# Patient Record
Sex: Male | Born: 1935 | Race: White | Hispanic: No | Marital: Married | Smoking: Former smoker
Health system: Southern US, Community
[De-identification: ages and names within clinical notes are randomized; demographics above are authoritative.]

## PROBLEM LIST (undated history)

## (undated) DIAGNOSIS — I4510 Unspecified right bundle-branch block: Secondary | ICD-10-CM

## (undated) DIAGNOSIS — J9811 Atelectasis: Secondary | ICD-10-CM

## (undated) DIAGNOSIS — I951 Orthostatic hypotension: Secondary | ICD-10-CM

## (undated) DIAGNOSIS — K649 Unspecified hemorrhoids: Secondary | ICD-10-CM

## (undated) HISTORY — PX: CATARACT EXTRACTION: SUR2

## (undated) HISTORY — PX: RETINAL DETACHMENT SURGERY: SHX105

## (undated) HISTORY — DX: Atelectasis: J98.11

## (undated) HISTORY — DX: Unspecified hemorrhoids: K64.9

## (undated) HISTORY — DX: Unspecified right bundle-branch block: I45.10

## (undated) HISTORY — DX: Orthostatic hypotension: I95.1

---
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
## 1970-02-24 HISTORY — PX: VASECTOMY: SHX75

## 1997-02-24 HISTORY — PX: CORNEAL TRANSPLANT: SHX108

## 2001-12-10 ENCOUNTER — Encounter: Admission: RE | Admit: 2001-12-10 | Discharge: 2001-12-10 | Payer: Self-pay | Admitting: *Deleted

## 2001-12-10 ENCOUNTER — Encounter: Payer: Self-pay | Admitting: *Deleted

## 2002-04-01 ENCOUNTER — Encounter: Admission: RE | Admit: 2002-04-01 | Discharge: 2002-04-01 | Payer: Self-pay | Admitting: *Deleted

## 2002-04-01 ENCOUNTER — Encounter: Payer: Self-pay | Admitting: *Deleted

## 2002-12-27 ENCOUNTER — Emergency Department (HOSPITAL_COMMUNITY): Admission: AD | Admit: 2002-12-27 | Discharge: 2002-12-27 | Payer: Self-pay | Admitting: Emergency Medicine

## 2003-06-02 ENCOUNTER — Encounter: Admission: RE | Admit: 2003-06-02 | Discharge: 2003-06-02 | Payer: Self-pay | Admitting: Family Medicine

## 2003-06-13 ENCOUNTER — Ambulatory Visit (HOSPITAL_COMMUNITY): Admission: RE | Admit: 2003-06-13 | Discharge: 2003-06-13 | Payer: Self-pay | Admitting: Gastroenterology

## 2005-12-04 ENCOUNTER — Encounter: Admission: RE | Admit: 2005-12-04 | Discharge: 2005-12-04 | Payer: Self-pay | Admitting: *Deleted

## 2006-04-29 ENCOUNTER — Encounter: Admission: RE | Admit: 2006-04-29 | Discharge: 2006-04-29 | Payer: Self-pay | Admitting: *Deleted

## 2006-06-30 ENCOUNTER — Encounter: Admission: RE | Admit: 2006-06-30 | Discharge: 2006-06-30 | Payer: Self-pay | Admitting: Endocrinology

## 2006-06-30 ENCOUNTER — Encounter (INDEPENDENT_AMBULATORY_CARE_PROVIDER_SITE_OTHER): Payer: Self-pay | Admitting: Specialist

## 2006-06-30 ENCOUNTER — Other Ambulatory Visit: Admission: RE | Admit: 2006-06-30 | Discharge: 2006-06-30 | Payer: Self-pay | Admitting: Interventional Radiology

## 2008-02-25 DIAGNOSIS — I4510 Unspecified right bundle-branch block: Secondary | ICD-10-CM

## 2008-02-25 HISTORY — DX: Unspecified right bundle-branch block: I45.10

## 2010-07-12 NOTE — Op Note (Signed)
NAME:  Darrell Ware, Darrell Ware                         ACCOUNT NO.:  [id number]   MEDICAL RECORD NO.:  [id number]                   PATIENT TYPE:  AMB   LOCATION:  ENDO                                 FACILITY:  [facility]   PHYSICIAN:  Graylin Shiver, M.D.                DATE OF BIRTH:  [birth year]   DATE OF PROCEDURE:  06/13/2003  DATE OF DISCHARGE:                                 OPERATIVE REPORT   PROCEDURE PERFORMED:  Colonoscopy.   INDICATIONS FOR PROCEDURE:  Screening.   Informed consent was obtained after explanation of the risks of bleeding,  infection, and perforation.   PREMEDICATIONS:  Fentanyl 50 mcg  IV, Versed 5 mg IV.   DESCRIPTION OF PROCEDURE:  With the patient in the left lateral decubitus  position, a rectal exam was performed and no masses were felt.  The Olympus  colonoscope was inserted into the rectum and advanced around the colon to  the cecum.  Cecal landmarks were identified.  The scope was brought out  visualizing the colonic mucosa.  There were scattered diverticula around the  colon.  The cecum looked normal.  The ascending colon showed some scattered  diverticula as did the transverse colon.  The descending colon and sigmoid  showed moderate diverticulosis.  The rectum was normal.  The patient  tolerated the procedure well without complications.   IMPRESSION:  Diverticulosis.   PLAN:  I would recommend a follow-up screening colonoscopy again in 10  years.                                               Graylin Shiver, M.D.    Germain Osgood  D:  06/13/2003  T:  06/14/2003  Job:  [id number]   cc:   Lavonda Jumbo, M.D.  Fax: [phone]

## 2011-05-22 ENCOUNTER — Other Ambulatory Visit: Payer: Self-pay | Admitting: Geriatric Medicine

## 2011-05-22 DIAGNOSIS — R9389 Abnormal findings on diagnostic imaging of other specified body structures: Secondary | ICD-10-CM

## 2011-05-26 ENCOUNTER — Ambulatory Visit
Admission: RE | Admit: 2011-05-26 | Discharge: 2011-05-26 | Disposition: A | Discharge status: Home / Self Care | Payer: Medicare Other | Source: Ambulatory Visit | Attending: Geriatric Medicine | Admitting: Geriatric Medicine

## 2011-05-26 DIAGNOSIS — R9389 Abnormal findings on diagnostic imaging of other specified body structures: Secondary | ICD-10-CM

## 2011-06-16 ENCOUNTER — Encounter: Payer: Self-pay | Admitting: Pulmonary Disease

## 2011-06-17 ENCOUNTER — Ambulatory Visit (INDEPENDENT_AMBULATORY_CARE_PROVIDER_SITE_OTHER): Payer: Medicare Other | Admitting: Pulmonary Disease

## 2011-06-17 ENCOUNTER — Encounter: Payer: Self-pay | Admitting: Pulmonary Disease

## 2011-06-17 ENCOUNTER — Other Ambulatory Visit (INDEPENDENT_AMBULATORY_CARE_PROVIDER_SITE_OTHER): Payer: Medicare Other

## 2011-06-17 VITALS — BP 140/80 | HR 75 | Temp 97.9°F | Ht 75.0 in | Wt 189.0 lb

## 2011-06-17 DIAGNOSIS — J849 Interstitial pulmonary disease, unspecified: Secondary | ICD-10-CM

## 2011-06-17 DIAGNOSIS — J8410 Pulmonary fibrosis, unspecified: Secondary | ICD-10-CM

## 2011-06-17 DIAGNOSIS — J84112 Idiopathic pulmonary fibrosis: Secondary | ICD-10-CM | POA: Insufficient documentation

## 2011-06-17 LAB — SEDIMENTATION RATE: Sed Rate: 41 mm/hr — ABNORMAL HIGH (ref 0–22)

## 2011-06-17 NOTE — Assessment & Plan Note (Signed)
The patient has significant subpleural interstitial lung disease on his recent CT chest that most closely fits usual interstitial pneumonitis.  There was no evidence for interstitial disease on a 2004 chest x-ray, but there may have been subtle abnormalities on the x-ray in 2007.  The patient denies any occupational exposures, has not been on any chronic medications that are known to cause interstitial lung disease, and has no history to suggest autoimmune disease.  Most likely, this is idiopathic pulmonary fibrosis.  Will schedule for pulmonary function studies, and also will do blood work for autoimmune screening.  Will see the patient back for discussion of his test results.  His chronic cough that he is describing has been present for 25 years, and sounds more upper airway in origin.  It is unlikely it has anything to do with his lung disease, though can't exclude a contribution.

## 2011-06-17 NOTE — Progress Notes (Signed)
  Subjective:    Patient ID: Darrell Ware, male    DOB: 01/09/1927, 78 y.o.   MRN: [id number]  HPI The patient is an 78 year old male who I've been asked to see for an abnormal CT chest.  The patient is very vigorous and active, and over the last one year has noticed a progressive dyspnea on exertion.  He normally exercises regularly except during the winter, and last summer he noticed that his exercise tolerance was not improving after a layoff.  The patient can walk miles on flat ground at a mild to moderate pace, but any elevations are an issue for him.  He has noticed recently that just walking through his house briskly can sometimes cause him to be short of breath.  He has had a recent CT chest that shows subpleural fibrosis, old granulomatous disease, and a 6 mm noncalcified nodule in the right upper lobe.  Chest x-rays from 2004 and 2007 do not show significant abnormalities, although these 2007 film shows possibly subtle increased interstitial markings.  The patient denies any occupational or asbestos exposure, and has no history to suggest autoimmune disease.  He has no family history of autoimmune disease.  He has never been on amiodarone, or chronic nitrofurantoin treatment.  He has had no significant cardiac issues.  He has had a mild dry cough dating back at least 25 years, and is of intermittent severity.  From his description, this sounds more upper airway, and related to the upper airway cough syndrome.   Review of Systems  Constitutional: Negative for fever and unexpected weight change.  HENT: Positive for congestion, sneezing and trouble swallowing. Negative for ear pain, nosebleeds, sore throat, rhinorrhea, dental problem, postnasal drip and sinus pressure.   Eyes: Negative for redness and itching.  Respiratory: Positive for cough and shortness of breath. Negative for chest tightness and wheezing.   Cardiovascular: Negative for palpitations and leg swelling.  Gastrointestinal:  Negative for nausea and vomiting.  Genitourinary: Negative for dysuria.  Musculoskeletal: Negative for joint swelling.  Skin: Negative for rash.  Neurological: Negative for headaches.  Hematological: Does not bruise/bleed easily.  Psychiatric/Behavioral: Negative for dysphoric mood. The patient is not nervous/anxious.        Objective:   Physical Exam Constitutional:  Well developed, no acute distress  HENT:  Nares patent without discharge, but septal deviation to left  Oropharynx without exudate, palate and uvula are normal  Eyes:  irreg right pupil post surgey, eomi, no scleral icterus  Neck:  No JVD, no TMG  Cardiovascular:  Normal rate, regular rhythm, no rubs or gallops.  No murmurs        Intact distal pulses  Pulmonary :  Normal breath sounds, no stridor or respiratory distress   No  rhonchi, or wheezing, faint bibasilar crackles.  Abdominal:  Soft, nondistended, bowel sounds present.  No tenderness noted.   Musculoskeletal:  No lower extremity edema noted.  Lymph Nodes:  No cervical lymphadenopathy noted  Skin:  No cyanosis noted  Neurologic:  Alert, appropriate, moves all 4 extremities without obvious deficit.         Assessment & Plan:

## 2011-06-17 NOTE — Patient Instructions (Signed)
Will check breathing tests, and arrange for followup once these are done. Will check bloodwork for possible autoimmune disease.

## 2011-06-18 LAB — CYCLIC CITRUL PEPTIDE ANTIBODY, IGG: Cyclic Citrullin Peptide Ab: <2 U/mL (ref 0.0–5.0)

## 2011-06-18 LAB — RHEUMATOID FACTOR: Rhuematoid fact SerPl-aCnc: <10 IU/mL (ref <=14)

## 2011-06-18 LAB — C-REACTIVE PROTEIN: CRP: 2.6 mg/dL — ABNORMAL HIGH (ref <0.60)

## 2011-06-18 LAB — ANTI-RIBONUCLEIC ACID ANTIBODY: SM/RNP: <1 AU/mL (ref <30)

## 2011-07-01 ENCOUNTER — Ambulatory Visit (INDEPENDENT_AMBULATORY_CARE_PROVIDER_SITE_OTHER): Payer: Medicare Other | Admitting: Pulmonary Disease

## 2011-07-01 DIAGNOSIS — J849 Interstitial pulmonary disease, unspecified: Secondary | ICD-10-CM

## 2011-07-01 DIAGNOSIS — J8410 Pulmonary fibrosis, unspecified: Secondary | ICD-10-CM

## 2011-07-01 LAB — PULMONARY FUNCTION TEST

## 2011-07-01 NOTE — Progress Notes (Signed)
PFT done today. 

## 2011-07-02 ENCOUNTER — Telehealth: Payer: Self-pay | Admitting: Pulmonary Disease

## 2011-07-02 NOTE — Telephone Encounter (Signed)
Pt needs ov to review his pfts and bloodwork.  Thanks.

## 2011-07-02 NOTE — Telephone Encounter (Signed)
LMOMTCB x1 for pt 

## 2011-07-04 NOTE — Telephone Encounter (Signed)
LMOMTCB x1 for pt 

## 2011-07-07 NOTE — Telephone Encounter (Signed)
LMOMTCB x 1 

## 2011-07-08 NOTE — Telephone Encounter (Signed)
Pt returned Lori's call & can be reached at [phone].  Darrell Ware

## 2011-07-08 NOTE — Telephone Encounter (Signed)
Patient is scheduled for follow-up on Fri., 07/25/11 @ 9:15am w/ KC.

## 2011-07-25 ENCOUNTER — Ambulatory Visit (INDEPENDENT_AMBULATORY_CARE_PROVIDER_SITE_OTHER): Payer: Medicare Other | Admitting: Pulmonary Disease

## 2011-07-25 ENCOUNTER — Encounter: Payer: Self-pay | Admitting: Pulmonary Disease

## 2011-07-25 VITALS — BP 122/62 | HR 73 | Temp 97.5°F | Ht 75.0 in | Wt 192.4 lb

## 2011-07-25 DIAGNOSIS — J849 Interstitial pulmonary disease, unspecified: Secondary | ICD-10-CM

## 2011-07-25 DIAGNOSIS — J8410 Pulmonary fibrosis, unspecified: Secondary | ICD-10-CM

## 2011-07-25 NOTE — Assessment & Plan Note (Signed)
The patient has interstitial lung disease that is most consistent with idiopathic pulmonary fibrosis.  The pattern is classic by CT chest, and he has no known exposures or indications for an autoimmune process.  I had a long discussion with he and his wife about IPF, and also the lack of good treatment options.  The good news here is the patient is quite active, and his pulmonary function studies are not overly abnormal.  I have offered to refer him to [facility] for consideration of research trials, but he is not interested at this moment.  Hopefully, pirfenidone will be approved next year.  In the meantime, I have asked him to stay very active, and keep his weight under good control.  I would like to see him back in one year for followup pulmonary function studies and chest x-ray, but he is to call if his breathing worsens in the interim.  I have spent 32 minutes with the patient and his wife today discussing his diagnosis, prognosis, and also possible treatment options.  I have answered all of their questions.

## 2011-07-25 NOTE — Progress Notes (Signed)
  Subjective:    Patient ID: Darrell Ware, male    DOB: [birth year], 78 y.o.   MRN: [id number]  HPI The patient comes in today for followup of his known interstitial lung disease.  His CT is most consistent with usual interstitial pneumonitis, and a recent autoimmune workup was unremarkable.  He has also had pulmonary function test recently, and that shows no obstructive lung disease, no restriction, and a mildly decreased diffusion capacity.  I have reviewed this study with him in detail, and answered all of his questions.   Review of Systems  Constitutional: Negative.  Negative for fever and unexpected weight change.  HENT: Negative.  Negative for ear pain, nosebleeds, congestion, sore throat, rhinorrhea, sneezing, trouble swallowing, dental problem, postnasal drip and sinus pressure.   Eyes: Negative.  Negative for redness and itching.  Respiratory: Positive for cough, shortness of breath and wheezing. Negative for chest tightness.   Cardiovascular: Negative.  Negative for palpitations and leg swelling.  Gastrointestinal: Negative.  Negative for nausea and vomiting.  Genitourinary: Negative.  Negative for dysuria.  Musculoskeletal: Negative.  Negative for joint swelling.  Skin: Negative.  Negative for rash.  Neurological: Negative.  Negative for headaches.  Hematological: Negative.  Does not bruise/bleed easily.  Psychiatric/Behavioral: Negative.  Negative for dysphoric mood. The patient is not nervous/anxious.        Objective:   Physical Exam Thin male in no acute distress Nose without purulence or discharge noted Chest with basilar crackles, no wheezing Cardiac exam is regular rate and rhythm Lower extremities without edema, no cyanosis Alert and oriented, moves all 4 extremities.       Assessment & Plan:

## 2011-07-25 NOTE — Patient Instructions (Signed)
Continue on your exercise program Will see you back in one year with cxr and breathing studies on the same day.  Please call if your condition changes in the interim.

## 2011-08-04 ENCOUNTER — Encounter: Payer: Self-pay | Admitting: Pulmonary Disease

## 2012-07-26 ENCOUNTER — Encounter: Payer: Medicare Other | Admitting: Pulmonary Disease

## 2012-07-26 ENCOUNTER — Ambulatory Visit: Payer: Medicare Other | Admitting: Pulmonary Disease

## 2012-08-13 ENCOUNTER — Ambulatory Visit: Payer: Medicare Other | Admitting: Pulmonary Disease

## 2012-08-30 ENCOUNTER — Ambulatory Visit (INDEPENDENT_AMBULATORY_CARE_PROVIDER_SITE_OTHER)
Admission: RE | Admit: 2012-08-30 | Discharge: 2012-08-30 | Disposition: A | Discharge status: Home / Self Care | Payer: Medicare Other | Source: Ambulatory Visit | Attending: Pulmonary Disease | Admitting: Pulmonary Disease

## 2012-08-30 ENCOUNTER — Ambulatory Visit (INDEPENDENT_AMBULATORY_CARE_PROVIDER_SITE_OTHER): Payer: Medicare Other | Admitting: Pulmonary Disease

## 2012-08-30 ENCOUNTER — Encounter: Payer: Self-pay | Admitting: Pulmonary Disease

## 2012-08-30 VITALS — BP 130/64 | HR 73 | Temp 97.2°F | Ht 73.5 in | Wt 190.0 lb

## 2012-08-30 DIAGNOSIS — J849 Interstitial pulmonary disease, unspecified: Secondary | ICD-10-CM

## 2012-08-30 DIAGNOSIS — J8410 Pulmonary fibrosis, unspecified: Secondary | ICD-10-CM

## 2012-08-30 LAB — PULMONARY FUNCTION TEST

## 2012-08-30 NOTE — Assessment & Plan Note (Signed)
The patient has had a significant decline in his pulmonary function studies and also his exertional tolerance since last visit.  We'll obviously need to have a chest x-ray done to look for worsening from a radiographic standpoint.  His history and laboratory findings are most consistent with idiopathic pulmonary fibrosis.  I have had another discussion with him about the pirfenidone trial, including the expanded access program.  He would like to discuss further with his wife who is a pharmacist, and will get back with me with his decision.  I have also offered to refer him to [facility] for other investigational trials.  Finally, I have stressed to him the importance of a consistent conditioning program, and offered to refer him to pulmonary rehabilitation.  He would like to do this on his own if possible

## 2012-08-30 NOTE — Progress Notes (Signed)
PFT done today. 

## 2012-08-30 NOTE — Patient Instructions (Addendum)
Think about the pirfenidone trial, and let me know Consider either pulmonary rehab or increasing your exercise program at home. Will send you downstairs for a chest xray, and will call you with results. followup with me in 6mos.

## 2012-08-30 NOTE — Progress Notes (Signed)
  Subjective:    Patient ID: Darrell Ware, male    DOB: 1926-09-22, 79 y.o.   MRN: [id number]  HPI The patient comes in today for followup of his interstitial lung disease, felt secondary to idiopathic pulmonary fibrosis.  Since last visit, he is clearly seen a decline in his exertional tolerance, and has started interfering with his scheduled exercise program.  He has had a mild dry cough, but does not feel that it is overly significant.  He had pulmonary function studies today that showed no airflow obstruction, mild to moderate restriction, and a severe decrease in his diffusion capacity.  Of note, there is been a significant decrease in his total lung capacity and DLCO since the last check one year ago.   Review of Systems  Constitutional: Negative for fever and unexpected weight change.  HENT: Negative for ear pain, nosebleeds, congestion, sore throat, rhinorrhea, sneezing, trouble swallowing, dental problem, postnasal drip and sinus pressure.   Eyes: Negative for redness and itching.  Respiratory: Positive for cough ( pt thinks d/t steroidal eye drops ) and shortness of breath. Negative for chest tightness and wheezing.   Cardiovascular: Negative for palpitations and leg swelling.  Gastrointestinal: Negative for nausea and vomiting.  Genitourinary: Negative for dysuria.  Musculoskeletal: Negative for joint swelling.  Skin: Negative for rash.  Neurological: Negative for headaches.  Hematological: Does not bruise/bleed easily.  Psychiatric/Behavioral: Negative for dysphoric mood. The patient is not nervous/anxious.        Objective:   Physical Exam Wd male in nad Nose without purulence or d/c noted. Neck without LN or TMG Chest with crackles 1/3 up bilat, no wheezing Cor with rrr LE without edema, +rash, no cyanosis Alert and oriented, moves all 4.        Assessment & Plan:

## 2012-09-02 ENCOUNTER — Telehealth: Payer: Self-pay | Admitting: Pulmonary Disease

## 2012-09-02 NOTE — Progress Notes (Signed)
Quick Note:  Spoke with patient made him aware of results as listed below per [facility] Verbalized understanding>>> refer to telephone note for further. ______

## 2012-09-02 NOTE — Telephone Encounter (Signed)
Spoke with patient, made him aware of results as listed below Patient requesting to be apart of perfinidone trial now Dr. Shelle Iron please advise thank you      Result Note    Please let pt know that cxr shows fibrosis is similar to recent films, but significant progression from 2007.

## 2012-09-03 NOTE — Telephone Encounter (Signed)
Let pt know that I have sent a message to the research nurse, and she will start the proceedings to see if he qualifies.

## 2012-09-03 NOTE — Telephone Encounter (Signed)
LMTCB

## 2012-09-06 NOTE — Telephone Encounter (Signed)
i spoke with pt and is aware. Nothing further was needed

## 2012-09-06 NOTE — Telephone Encounter (Signed)
Returning call can be reached at [phone].Darrell Ware

## 2012-09-07 ENCOUNTER — Telehealth: Payer: Self-pay | Admitting: Pulmonary Disease

## 2012-09-07 NOTE — Telephone Encounter (Signed)
Pt has already been notified. Nothing further needed.   Barbaraann Share, MD at 09/03/2012 4:16 PM   Status: Signed            Let pt know that I have sent a message to the research nurse, and she will start the proceedings to see if he qualifies.     Tommie Sams, CMA at 09/06/2012 10:02 AM    Status: Signed             I spoke with pt and is aware. Nothing further was needed

## 2012-09-13 ENCOUNTER — Encounter: Payer: Self-pay | Admitting: Pulmonary Disease

## 2012-09-17 ENCOUNTER — Ambulatory Visit (INDEPENDENT_AMBULATORY_CARE_PROVIDER_SITE_OTHER): Payer: Self-pay | Admitting: Pulmonary Disease

## 2012-09-17 ENCOUNTER — Encounter: Payer: Self-pay | Admitting: Pulmonary Disease

## 2012-09-17 VITALS — BP 122/72 | HR 74 | Temp 98.0°F | Ht 75.0 in | Wt 191.8 lb

## 2012-09-17 DIAGNOSIS — J849 Interstitial pulmonary disease, unspecified: Secondary | ICD-10-CM

## 2012-09-17 DIAGNOSIS — J8410 Pulmonary fibrosis, unspecified: Secondary | ICD-10-CM

## 2012-09-17 LAB — EKG 12-LEAD

## 2012-09-17 NOTE — Progress Notes (Signed)
Chief Complaint  Patient presents with  . Research    Perfinidone Study    History of Present Illness: Darrell Ware is a 79 y.o. male with pulmonary fibrosis.  He is here for evaluation prior to enrolling in pirfenidone study.  He has chronic dry cough.  He has noticed more dyspnea with exertion.  He had reaction to cialis which caused a rash and swelling >> these have gotten better.   He denies chest pain, fever, sweats, joint pain, abdominal pain, nausea, or diarrhea.  PFT from 08/30/12 reviewed >> severe diffusion defect.  ECG today shows sinus rhythm with RBBB and QTc 402 msec.  Darrell Ware  has a past medical history of Hemorrhoid; Postural hypotension; Chronic bronchitis; RBBB (2010); Atelectasis; and Inguinal hernia.  Darrell Ware  has past surgical history that includes Tonsillectomy (1935); left arm fracture (1950); Cataract extraction; Retinal detachment surgery; Corneal transplant (1999); and Vasectomy (1972).  Prior to Admission medications   Medication Sig Start Date End Date Taking? Authorizing Provider  AZOPT 1 % ophthalmic suspension Place 1 drop into the right eye 2 (two) times daily. 08/13/12  Yes Historical Provider, MD  Beta Sitosterol 40 % POWD 1 tablet by Does not apply route 2 (two) times daily.   Yes Historical Provider, MD  Cyanocobalamin (B-12) 2500 MCG SUBL Place under the tongue daily.   Yes Historical Provider, MD  Flaxseed, Linseed, (FLAX SEED OIL PO) Take by mouth daily.   Yes Historical Provider, MD  LOTEMAX 0.5 % GEL Place 1 drop into the right eye 3 (three) times daily. 08/13/12  Yes Historical Provider, MD  Multiple Vitamin (MULTIVITAMIN) capsule Take 1 capsule by mouth daily.   Yes Historical Provider, MD  NON FORMULARY NAC 200mg  three times a day   Yes Historical Provider, MD  NON FORMULARY [place] supplement twice daily   Yes Historical Provider, MD  ZIOPTAN 0.0015 % SOLN Place 1 drop into the right eye daily. 08/13/12  Yes Historical  Provider, MD    Allergies  Allergen Reactions  . Cialis (Tadalafil) Rash     Physical Exam:  General - No distress ENT - No sinus tenderness, no oral exudate, no LAN, anisocoria Cardiac - s1s2 regular, no murmur Chest - basilar crackles Back - No focal tenderness Abd - Soft, non-tender Ext - No edema Neuro - Normal strength Skin - faint brown macular rash on arms, legs b/l Psych - normal mood, and behavior   Assessment/Plan:  Darrell Helling, MD [facility] Pager:  [phone]

## 2012-09-17 NOTE — Assessment & Plan Note (Signed)
He is to follow up with Dr. Shelle Iron.  He is to complete evaluation for pirfenidone trial.

## 2012-09-29 ENCOUNTER — Other Ambulatory Visit: Payer: Self-pay

## 2012-12-30 ENCOUNTER — Other Ambulatory Visit: Payer: Self-pay

## 2013-03-02 ENCOUNTER — Encounter: Payer: Self-pay | Admitting: Pulmonary Disease

## 2013-03-02 ENCOUNTER — Ambulatory Visit (INDEPENDENT_AMBULATORY_CARE_PROVIDER_SITE_OTHER): Payer: Medicare Other | Admitting: Pulmonary Disease

## 2013-03-02 VITALS — BP 112/72 | HR 70 | Temp 97.6°F | Ht 75.0 in | Wt 188.0 lb

## 2013-03-02 DIAGNOSIS — J849 Interstitial pulmonary disease, unspecified: Secondary | ICD-10-CM

## 2013-03-02 DIAGNOSIS — J8410 Pulmonary fibrosis, unspecified: Secondary | ICD-10-CM

## 2013-03-02 NOTE — Patient Instructions (Signed)
Continue to stay active and work on physical conditioning. Do some research on OFEV, and let me know if you wish to consider trying the medication. followup with me in 6mos, with breathing studies the same day.

## 2013-03-02 NOTE — Progress Notes (Signed)
   Subjective:    Patient ID: Darrell Ware, male    DOB: June 20, 1926, 80 y.o.   MRN: [id number]  HPI Patient comes in today for followup of his known pulmonary fibrosis, but is felt secondary to IPF. He is been staying very active and exercises regularly, but feels that his breathing has declined somewhat since the last visit. He was enrolled in the pirfenidone trial, but was unable to stay on the medication because of diarrhea and malaise.  He continues to have some mild cough, but it is no worse than his usual baseline. He is asking about OFEV today.    Review of Systems  Constitutional: Negative for fever and unexpected weight change.  HENT: Negative for congestion, dental problem, ear pain, nosebleeds, postnasal drip, rhinorrhea, sinus pressure, sneezing, sore throat and trouble swallowing.   Eyes: Negative for redness and itching.  Respiratory: Negative for cough, chest tightness, shortness of breath and wheezing.   Cardiovascular: Negative for palpitations and leg swelling.  Gastrointestinal: Negative for nausea and vomiting.  Genitourinary: Negative for dysuria.  Musculoskeletal: Negative for joint swelling.  Skin: Negative for rash.  Neurological: Negative for headaches.  Hematological: Does not bruise/bleed easily.  Psychiatric/Behavioral: Negative for dysphoric mood. The patient is not nervous/anxious.        Objective:   Physical Exam Well-developed male in no acute distress Nose without purulence or discharge noted Neck without lymphadenopathy or thyromegaly Chest with basilar crackles, otherwise clear Cardiac exam with regular rate and rhythm Lower extremities without edema, no cyanosis Alert and oriented, moves all 4 extremities.       Assessment & Plan:

## 2013-03-02 NOTE — Assessment & Plan Note (Signed)
The pt continues to be active and exercises regularly, but has seen some decline in his exertional tolerance over the last 6mos.  I have had a long discussion with him about OFEV, and he will do some research and make a decision.  Will see him back in 6mos with breathing studies.

## 2013-03-02 NOTE — Addendum Note (Signed)
Addended by: Maisie FusGREEN, Diella Gillingham M on: 03/02/2013 02:17 PM   Modules accepted: Orders

## 2013-08-30 ENCOUNTER — Encounter: Payer: Self-pay | Admitting: Pulmonary Disease

## 2013-08-30 ENCOUNTER — Ambulatory Visit: Payer: Medicare Other | Admitting: Pulmonary Disease

## 2013-08-30 ENCOUNTER — Other Ambulatory Visit (INDEPENDENT_AMBULATORY_CARE_PROVIDER_SITE_OTHER): Payer: Medicare Other

## 2013-08-30 ENCOUNTER — Ambulatory Visit (INDEPENDENT_AMBULATORY_CARE_PROVIDER_SITE_OTHER): Payer: Medicare Other | Admitting: Pulmonary Disease

## 2013-08-30 VITALS — BP 110/68 | HR 64 | Temp 97.8°F | Ht 73.0 in | Wt 187.0 lb

## 2013-08-30 DIAGNOSIS — Z5181 Encounter for therapeutic drug level monitoring: Secondary | ICD-10-CM

## 2013-08-30 DIAGNOSIS — J849 Interstitial pulmonary disease, unspecified: Secondary | ICD-10-CM

## 2013-08-30 LAB — HEPATIC FUNCTION PANEL
ALT: 21 U/L (ref 0–53)
AST: 23 U/L (ref 0–37)
Albumin: 4 g/dL (ref 3.5–5.2)
Alkaline Phosphatase: 47 U/L (ref 39–117)
BILIRUBIN DIRECT: 0.1 mg/dL (ref 0.0–0.3)
Total Bilirubin: 0.6 mg/dL (ref 0.2–1.2)
Total Protein: 7.1 g/dL (ref 6.0–8.3)

## 2013-08-30 NOTE — Progress Notes (Signed)
   Subjective:    Patient ID: Darrell Ware, male    DOB: 16-Jun-1926, 80 y.o.   MRN: [id number]  HPI The patient comes in today for followup of his idiopathic pulmonary fibrosis. He has continued to be very active since the last visit, but has noted some mild decrease in his exertional tolerance. He has very little dry cough that is intermittent in nature, and really doesn't bother him overall. He has had pulmonary function studies today which showed fairly good stability of his total lung capacity in his diffusion capacity. I have reviewed the studies with him in detail.   Review of Systems  Constitutional: Negative for fever and unexpected weight change.  HENT: Negative for congestion, dental problem, ear pain, nosebleeds, postnasal drip, rhinorrhea, sinus pressure, sneezing, sore throat and trouble swallowing.   Eyes: Negative for redness and itching.  Respiratory: Positive for shortness of breath. Negative for cough, chest tightness and wheezing.   Cardiovascular: Negative for palpitations and leg swelling.  Gastrointestinal: Negative for nausea and vomiting.  Genitourinary: Negative for dysuria.  Musculoskeletal: Negative for joint swelling.  Skin: Negative for rash.  Neurological: Negative for headaches.  Hematological: Does not bruise/bleed easily.  Psychiatric/Behavioral: Negative for dysphoric mood. The patient is not nervous/anxious.        Objective:   Physical Exam  thin male in no acute distress Nose without purulence or discharge noted Neck without lymphadenopathy or thyromegaly Chest with crackles one third the way up bilaterally, no wheezing Cardiac exam with regular rate and rhythm Lower extremities without edema, no cyanosis Alert and oriented, moves all 4 extremities.       Assessment & Plan:

## 2013-08-30 NOTE — Assessment & Plan Note (Signed)
The patient's pulmonary function studies today show stability from last year, but the patient feels that he has lost some ground overall. After a long discussion today, he would like to consider starting on OFEV for treatment of his IPF. I have had a long discussion with him about the medication, including its side effects and warnings. Will check liver function tests today to establish baseline, and will see him back after he has been on the medication for 4 weeks.

## 2013-08-30 NOTE — Progress Notes (Signed)
PFT done today. 

## 2013-08-30 NOTE — Patient Instructions (Signed)
Will start paperwork to initiate treatment with OFEV. Will check baseline liver tests today. Continue exercise program Would like to see you back 4 weeks after starting OFEV.

## 2013-09-05 ENCOUNTER — Telehealth: Payer: Self-pay | Admitting: Pulmonary Disease

## 2013-09-05 LAB — PULMONARY FUNCTION TEST
DL/VA % pred: 55 %
DL/VA: 2.63 ml/min/mmHg/L
DLCO unc % pred: 37 %
DLCO unc: 13.51 ml/min/mmHg
FEF 25-75 POST: 3.6 L/s
FEF 25-75 Pre: 2.96 L/sec
FEF2575-%Change-Post: 21 %
FEF2575-%PRED-POST: 178 %
FEF2575-%Pred-Pre: 146 %
FEV1-%CHANGE-POST: 3 %
FEV1-%PRED-POST: 104 %
FEV1-%Pred-Pre: 100 %
FEV1-POST: 3.21 L
FEV1-PRE: 3.09 L
FEV1FVC-%CHANGE-POST: 4 %
FEV1FVC-%PRED-PRE: 111 %
FEV6-%Change-Post: 0 %
FEV6-%PRED-PRE: 95 %
FEV6-%Pred-Post: 94 %
FEV6-Post: 3.84 L
FEV6-Pre: 3.87 L
FEV6FVC-%Change-Post: 0 %
FEV6FVC-%PRED-PRE: 106 %
FEV6FVC-%Pred-Post: 106 %
FVC-%CHANGE-POST: 0 %
FVC-%Pred-Post: 89 %
FVC-%Pred-Pre: 89 %
FVC-PRE: 3.89 L
FVC-Post: 3.88 L
PRE FEV1/FVC RATIO: 79 %
PRE FEV6/FVC RATIO: 99 %
Post FEV1/FVC ratio: 83 %
Post FEV6/FVC ratio: 99 %
RV % PRED: 46 %
RV: 1.35 L
TLC % pred: 69 %
TLC: 5.31 L

## 2013-09-05 NOTE — Telephone Encounter (Signed)
For one year.

## 2013-09-05 NOTE — Telephone Encounter (Signed)
Please advise how many refill pt may have Dr. Shelle Ironlance thanks

## 2013-09-05 NOTE — Telephone Encounter (Signed)
I called spoke with Stacy.  She is not sure of the notes documented.  They show approval for the medication. She is going to have a rep call us tomorrow. Will await call

## 2013-09-12 ENCOUNTER — Telehealth: Payer: Self-pay | Admitting: Pulmonary Disease

## 2013-09-12 NOTE — Telephone Encounter (Signed)
The medication is ofev.Caren GriffinsStanley A Dalton

## 2013-09-12 NOTE — Telephone Encounter (Signed)
Per OV note pt is too follow-up 4 weeks after starting OFEV and KC states we can do lft at that time. OV set for 10-10-13. Carron CurieJennifer Castillo, CMA

## 2013-09-28 IMAGING — CT CT CHEST W/O CM
3 of 6 series · 15 of 30 positions shown, 16 images · non-contrast
Comparison: None

CLINICAL DATA: Evaluate for interstitial lung disease

CT CHEST WITHOUT CONTRAST
TECHNIQUE: Multidetector CT imaging of the chest was performed
following the standard protocol without IV contrast.

[Series 3: chest w/o · axial · non-contrast · 0.78mm/px · z∈[-221,-51]mm · 3 of 68 slices shown]
[im 17/68  lung]
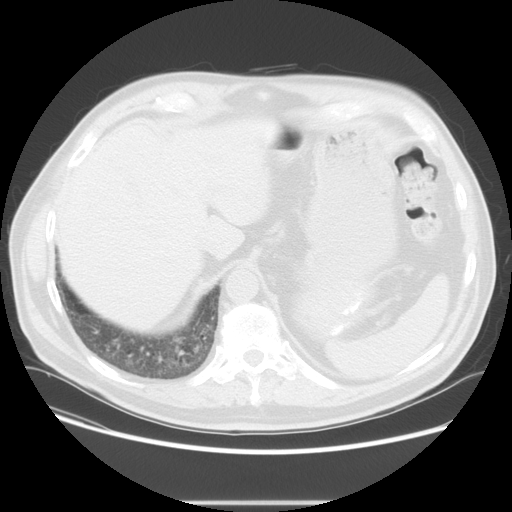
[im 34/68  lung]
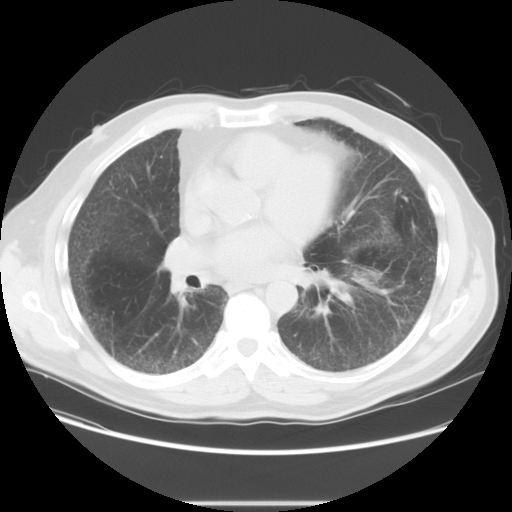
[im 51/68  lung]
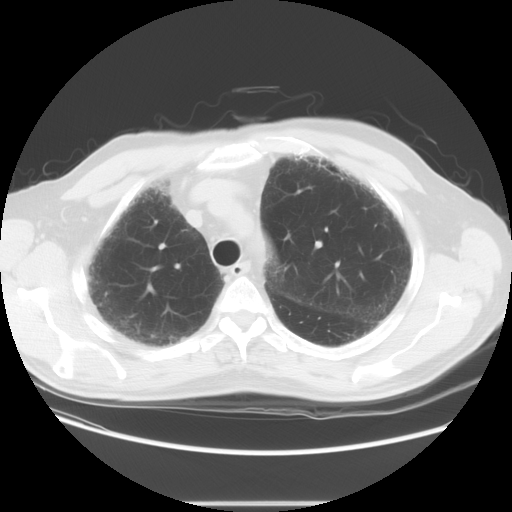

[Series 4: lung windows · axial · 0.78mm/px · z∈[-211,-31]mm · 4 of 62 slices shown, 5 images]
[im 13/62  mediastinal]
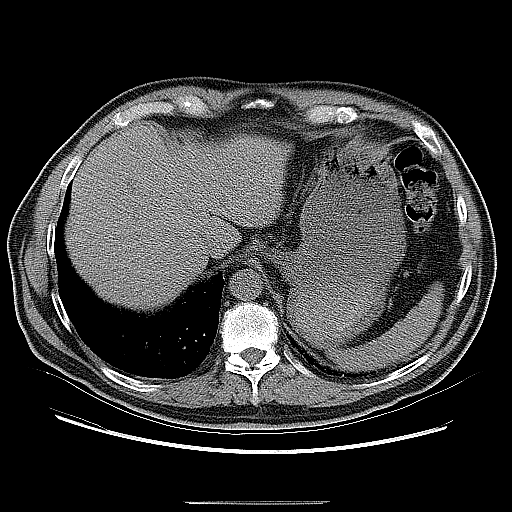
[im 13/62  lung]
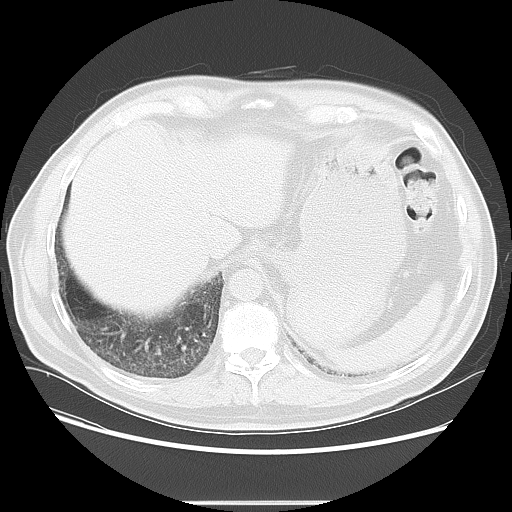
[im 25/62  lung]
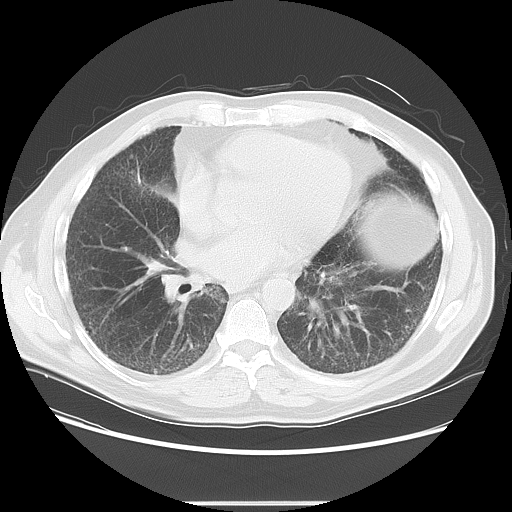
[im 37/62  lung]
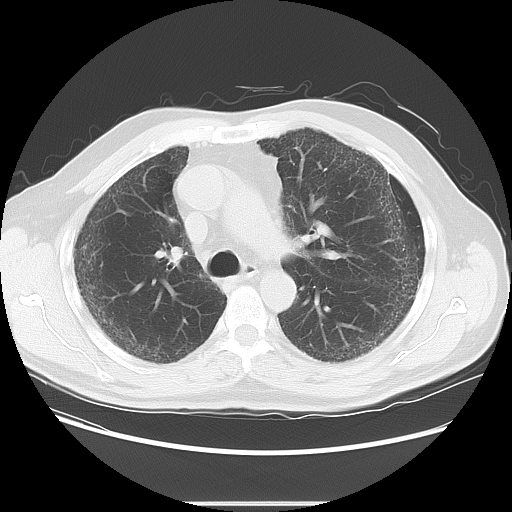
[im 49/62  lung]
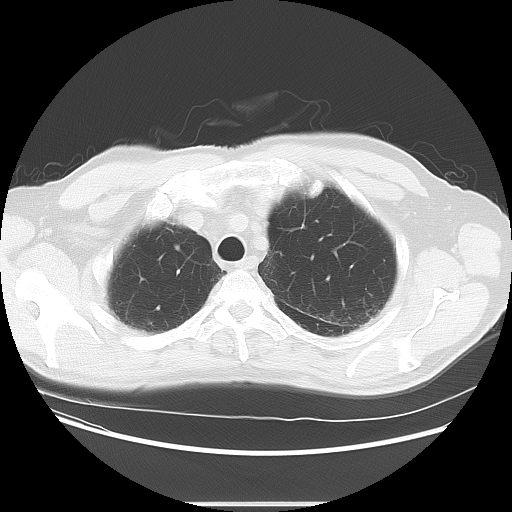

[Series 602: sagittal body · sagittal · 0.78mm/px · 8 of 161 slices shown]
[im 12/161  mediastinal]
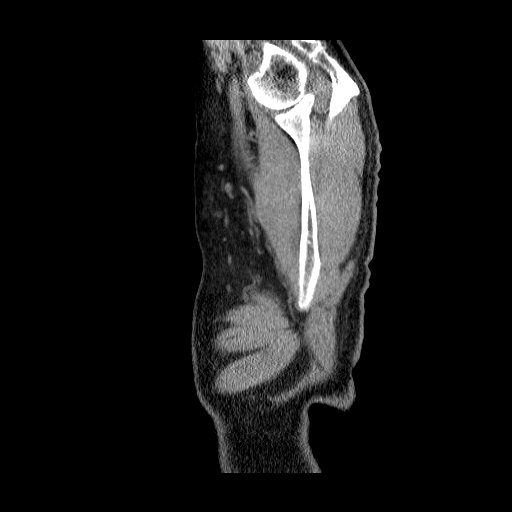
[im 35/161  mediastinal]
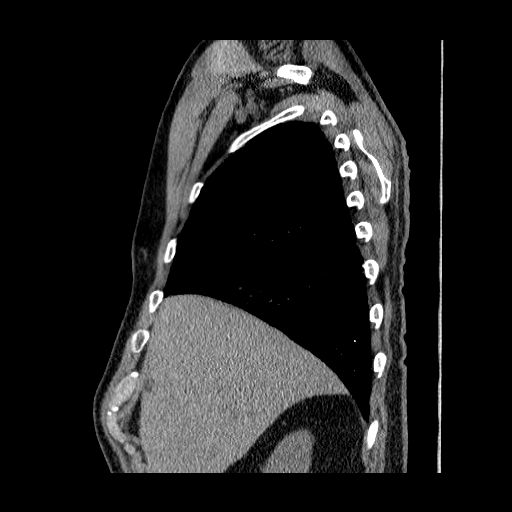
[im 58/161  mediastinal]
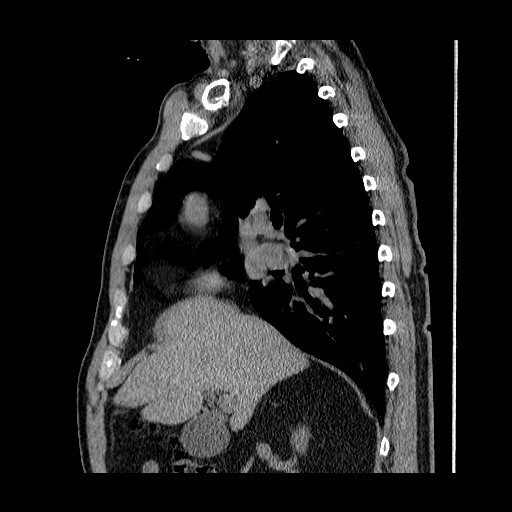
[im 69/161  mediastinal]
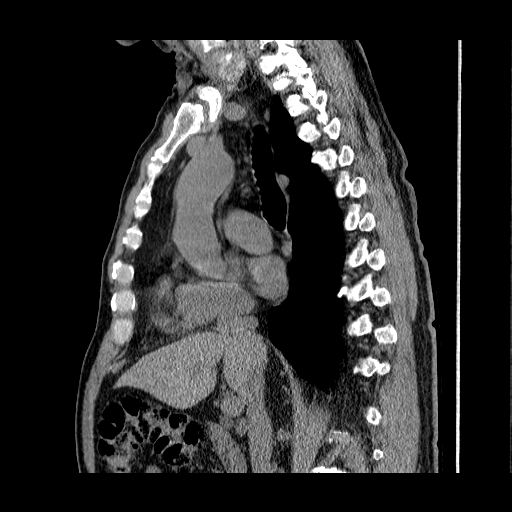
[im 92/161  mediastinal]
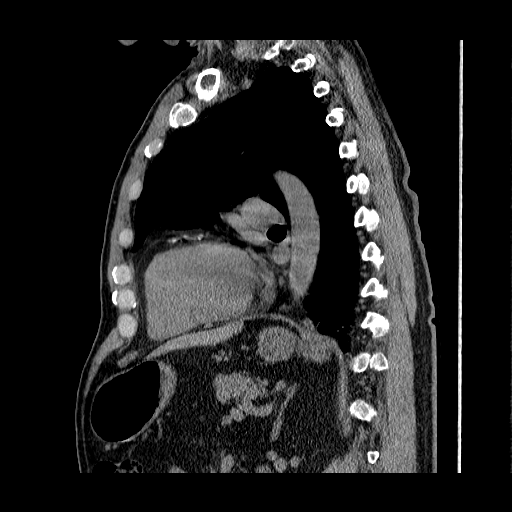
[im 103/161  mediastinal]
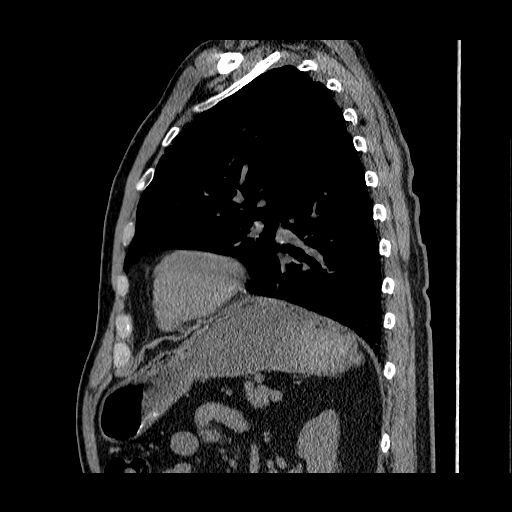
[im 126/161  mediastinal]
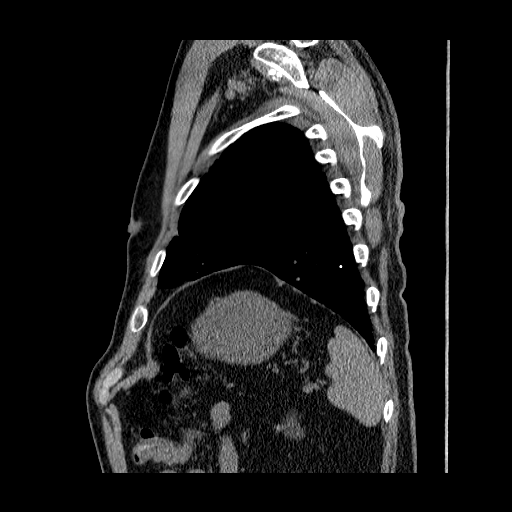
[im 149/161  mediastinal]
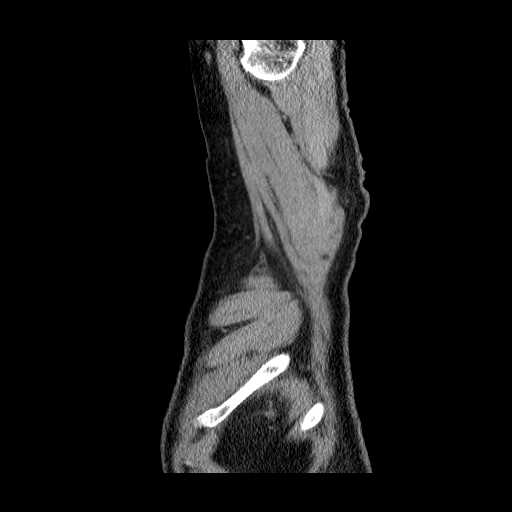

[15 of 30 positions shown; findings below may reference images not displayed]

FINDINGS: There are no enlarged axillary or supraclavicular lymph
nodes.

There are calcifications involving the LAD coronary artery.

No pericardial or pleural effusion.

There is diffuse, peripheral and basilar predominant interstitial
reticulation.  Suspect mild honeycombing within the lung bases.

Several small granulomas are identified within both lungs.  There
also several noncalcified nodules.  The largest is in the right
upper lobe and measures 6.3 mm, image 22.

Review of the visualized bony structures is unremarkable.

Limited imaging through the upper abdomen demonstrates a small
nonobstructing calculus within the upper pole the left kidney.
There is a simple appearing cyst within the left kidney which
measures 4.6 the centimeters. This is incompletely characterized
without IV contrast.  Small cyst is noted within the anterior right
hepatic lobe measuring 6 mm.
IMPRESSION: 1.  Bilateral, peripheral and basilar predominant interstitial
reticulation and mild subpleural honeycombing is identified and is
suspicious for pulmonary fibrosis.  Advise further evaluation with
pulmonary function tests.
2.  Prior granulomatous disease.
3.  Noncalcified nodule within the right upper lobe measures
mm. If the patient is at high risk for bronchogenic carcinoma,
follow-up chest CT at 6-12 months is recommended.  If the patient
is at low risk for bronchogenic carcinoma, follow-up chest CT at 12
months is recommended.  This recommendation follows the consensus
statement: Guidelines for Management of Small Pulmonary Nodules
Detected on CT Scans: A Statement from the [HOSPITAL] as
published in Radiology 4998; [DATE].

## 2013-10-10 ENCOUNTER — Other Ambulatory Visit (INDEPENDENT_AMBULATORY_CARE_PROVIDER_SITE_OTHER): Payer: Medicare Other

## 2013-10-10 ENCOUNTER — Ambulatory Visit (INDEPENDENT_AMBULATORY_CARE_PROVIDER_SITE_OTHER): Payer: Medicare Other | Admitting: Pulmonary Disease

## 2013-10-10 ENCOUNTER — Encounter: Payer: Self-pay | Admitting: Pulmonary Disease

## 2013-10-10 VITALS — BP 110/72 | HR 72 | Temp 97.6°F | Ht 75.0 in | Wt 188.4 lb

## 2013-10-10 DIAGNOSIS — J849 Interstitial pulmonary disease, unspecified: Secondary | ICD-10-CM

## 2013-10-10 DIAGNOSIS — J8410 Pulmonary fibrosis, unspecified: Secondary | ICD-10-CM

## 2013-10-10 LAB — HEPATIC FUNCTION PANEL
ALBUMIN: 3.6 g/dL (ref 3.5–5.2)
ALT: 23 U/L (ref 0–53)
AST: 25 U/L (ref 0–37)
Alkaline Phosphatase: 48 U/L (ref 39–117)
Bilirubin, Direct: 0.1 mg/dL (ref 0.0–0.3)
TOTAL PROTEIN: 6.8 g/dL (ref 6.0–8.3)
Total Bilirubin: 0.5 mg/dL (ref 0.2–1.2)

## 2013-10-10 NOTE — Patient Instructions (Signed)
Will put labwork in the computer to get liver tests in one month, then a month after that. Stay on OFEV, and call if having any tolerance issues. followup with me again in 3mos.

## 2013-10-10 NOTE — Assessment & Plan Note (Signed)
The patient is doing very well on his anti-fibrotic therapy, and is having none of the usual side effects. He continues to stay very active, and has no significant cough. I would like to continue him on the medication, and  keep up with his liver function tests. I would like to see him back in 3 months to check on things.

## 2013-10-10 NOTE — Progress Notes (Signed)
   Subjective:    Patient ID: Darrell BasquesDenver H Umbarger, male    DOB: [birth year], 80 y.o.   MRN: [id number]  HPI The patient comes in today for followup of his idiopathic pulmonary fibrosis. He has been started on OFEV, and has done very well over the last 4 weeks. He is having no significant diarrhea or dyspepsia. He had his liver function test checked today, and we'll do this monthly for 2 more draws. He continues to stay very active, and has seen no change in his breathing. He has no significant cough.   Review of Systems  Constitutional: Negative for fever and unexpected weight change.  HENT: Negative for congestion, dental problem, ear pain, nosebleeds, postnasal drip, rhinorrhea, sinus pressure, sneezing, sore throat and trouble swallowing.   Eyes: Negative for redness and itching.  Respiratory: Negative for cough, chest tightness, shortness of breath and wheezing.   Cardiovascular: Negative for palpitations and leg swelling.  Gastrointestinal: Negative for nausea and vomiting.  Genitourinary: Negative for dysuria.  Musculoskeletal: Negative for joint swelling.  Skin: Negative for rash.  Neurological: Negative for headaches.  Hematological: Does not bruise/bleed easily.  Psychiatric/Behavioral: Negative for dysphoric mood. The patient is not nervous/anxious.        Objective:   Physical Exam Thin male in no acute distress Nose without purulence or discharge noted Neck without lymphadenopathy or thyromegaly Chest with crackles one third the way up bilaterally, no wheezing Cardiac exam with regular rate and rhythm Lower extremities without edema, no cyanosis Alert and oriented, moves all 4 extremities.       Assessment & Plan:

## 2013-11-11 ENCOUNTER — Telehealth: Payer: Self-pay | Admitting: Pulmonary Disease

## 2013-11-11 NOTE — Telephone Encounter (Signed)
Monthly for 2 more draws

## 2013-11-11 NOTE — Telephone Encounter (Signed)
Spoke with the pt  He is asking when to have LFT's checked again  He thought is was 1 month, but maybe 3 mo? Last labs done 10/10/13  Please advise, thanks!

## 2013-11-11 NOTE — Telephone Encounter (Signed)
Order placed and pt aware Nothing further needed pt

## 2013-11-14 ENCOUNTER — Other Ambulatory Visit (INDEPENDENT_AMBULATORY_CARE_PROVIDER_SITE_OTHER): Payer: Medicare Other

## 2013-11-14 DIAGNOSIS — J84112 Idiopathic pulmonary fibrosis: Secondary | ICD-10-CM

## 2013-11-14 LAB — HEPATIC FUNCTION PANEL
ALK PHOS: 57 U/L (ref 39–117)
ALT: 27 U/L (ref 0–53)
AST: 24 U/L (ref 0–37)
Albumin: 4 g/dL (ref 3.5–5.2)
BILIRUBIN DIRECT: 0.1 mg/dL (ref 0.0–0.3)
TOTAL PROTEIN: 7.6 g/dL (ref 6.0–8.3)
Total Bilirubin: 0.4 mg/dL (ref 0.2–1.2)

## 2013-12-17 ENCOUNTER — Encounter: Payer: Self-pay | Admitting: *Deleted

## 2013-12-20 ENCOUNTER — Telehealth: Payer: Self-pay | Admitting: Pulmonary Disease

## 2013-12-20 DIAGNOSIS — J849 Interstitial pulmonary disease, unspecified: Secondary | ICD-10-CM

## 2013-12-20 NOTE — Telephone Encounter (Signed)
LMTCB- I have placed lab order in [facility] for patient to come by the office and have Hepatic Panel drawn.

## 2013-12-20 NOTE — Telephone Encounter (Signed)
Thanks for letting the patient know of his labs ordered.

## 2013-12-20 NOTE — Telephone Encounter (Signed)
I let pt know that lab orders were in system and that he could come in @ anytime to have drawn.Caren GriffinsStanley A Dalton

## 2013-12-21 ENCOUNTER — Other Ambulatory Visit (INDEPENDENT_AMBULATORY_CARE_PROVIDER_SITE_OTHER): Payer: Medicare Other

## 2013-12-21 DIAGNOSIS — J849 Interstitial pulmonary disease, unspecified: Secondary | ICD-10-CM

## 2013-12-21 LAB — HEPATIC FUNCTION PANEL
ALBUMIN: 3.5 g/dL (ref 3.5–5.2)
ALK PHOS: 51 U/L (ref 39–117)
ALT: 27 U/L (ref 0–53)
AST: 25 U/L (ref 0–37)
BILIRUBIN DIRECT: 0.1 mg/dL (ref 0.0–0.3)
Total Bilirubin: 0.8 mg/dL (ref 0.2–1.2)
Total Protein: 7.2 g/dL (ref 6.0–8.3)

## 2013-12-23 NOTE — Progress Notes (Signed)
Quick Note:  lmtcb ______ 

## 2013-12-27 ENCOUNTER — Telehealth: Payer: Self-pay | Admitting: Pulmonary Disease

## 2013-12-28 NOTE — Telephone Encounter (Signed)
Pt aware of results 

## 2014-01-10 ENCOUNTER — Encounter: Payer: Self-pay | Admitting: Pulmonary Disease

## 2014-01-10 ENCOUNTER — Ambulatory Visit (INDEPENDENT_AMBULATORY_CARE_PROVIDER_SITE_OTHER): Payer: Medicare Other | Admitting: Pulmonary Disease

## 2014-01-10 VITALS — BP 124/62 | HR 66 | Temp 97.0°F | Ht 75.0 in | Wt 192.4 lb

## 2014-01-10 DIAGNOSIS — J84112 Idiopathic pulmonary fibrosis: Secondary | ICD-10-CM

## 2014-01-10 NOTE — Progress Notes (Signed)
   Subjective:    Patient ID: Carlyle BasquesDenver H Chio, male    DOB: 1926/09/09, 80 y.o.   MRN: [id number]  HPI The patient comes in today for follow-up of his known idiopathic pulmonary fibrosis. He has been started on OFEV this summer, and has done very well with the medication. He is having no GI side effects, and his liver function tests have been normal. He remains quite active, and has had no change in his exertional tolerance.   Review of Systems  Constitutional: Negative for fever and unexpected weight change.  HENT: Negative for congestion, dental problem, ear pain, nosebleeds, postnasal drip, rhinorrhea, sinus pressure, sneezing, sore throat and trouble swallowing.   Eyes: Negative for redness and itching.  Respiratory: Positive for shortness of breath. Negative for cough, chest tightness and wheezing.   Cardiovascular: Negative for palpitations and leg swelling.  Gastrointestinal: Negative for nausea and vomiting.  Genitourinary: Negative for dysuria.  Musculoskeletal: Negative for joint swelling.  Skin: Negative for rash.  Neurological: Negative for headaches.  Hematological: Does not bruise/bleed easily.  Psychiatric/Behavioral: Negative for dysphoric mood. The patient is not nervous/anxious.        Objective:   Physical Exam Thin male in no acute distress Nose without purulence or discharge noted Neck without lymphadenopathy or thyromegaly Chest with mild basilar crackles, no wheezes Cardiac exam with regular rate and rhythm Lower extremities without edema, no cyanosis Alert and oriented, moves all 4 extremities.       Assessment & Plan:

## 2014-01-10 NOTE — Assessment & Plan Note (Signed)
The patient is doing very well on his anti-fibrotic medication. He has had no GI issues, and his liver function tests have remained normal. He feels that his functional status is stable, and has been staying very active. He has no significant cough at this time. I would like to see him back in 3-4 months where we will recheck his pulmonary function studies.

## 2014-01-10 NOTE — Patient Instructions (Signed)
Continue on OFEV Stay as active as possible Will see you back in 3-4 mos, with breathing studies and liver tests on same day.

## 2014-05-11 ENCOUNTER — Encounter: Payer: Self-pay | Admitting: Pulmonary Disease

## 2014-05-11 ENCOUNTER — Ambulatory Visit (INDEPENDENT_AMBULATORY_CARE_PROVIDER_SITE_OTHER): Payer: Medicare Other | Admitting: Pulmonary Disease

## 2014-05-11 VITALS — BP 138/64 | HR 70 | Temp 97.0°F | Ht 73.0 in | Wt 191.0 lb

## 2014-05-11 DIAGNOSIS — J84112 Idiopathic pulmonary fibrosis: Secondary | ICD-10-CM

## 2014-05-11 LAB — PULMONARY FUNCTION TEST
DL/VA % PRED: 55 %
DL/VA: 2.65 ml/min/mmHg/L
DLCO UNC: 13.71 ml/min/mmHg
DLCO unc % pred: 37 %
FEF 25-75 POST: 2.82 L/s
FEF 25-75 Pre: 2.85 L/sec
FEF2575-%Change-Post: 0 %
FEF2575-%PRED-POST: 141 %
FEF2575-%Pred-Pre: 143 %
FEV1-%Change-Post: 0 %
FEV1-%PRED-POST: 99 %
FEV1-%Pred-Pre: 99 %
FEV1-Post: 3.02 L
FEV1-Pre: 3.02 L
FEV1FVC-%CHANGE-POST: 0 %
FEV1FVC-%Pred-Pre: 114 %
FEV6-%Change-Post: 0 %
FEV6-%Pred-Post: 92 %
FEV6-%Pred-Pre: 93 %
FEV6-POST: 3.73 L
FEV6-PRE: 3.76 L
FEV6FVC-%Change-Post: 0 %
FEV6FVC-%PRED-POST: 107 %
FEV6FVC-%Pred-Pre: 107 %
FVC-%CHANGE-POST: 0 %
FVC-%PRED-POST: 86 %
FVC-%PRED-PRE: 87 %
FVC-PRE: 3.76 L
FVC-Post: 3.74 L
POST FEV6/FVC RATIO: 100 %
PRE FEV6/FVC RATIO: 100 %
Post FEV1/FVC ratio: 81 %
Pre FEV1/FVC ratio: 80 %
RV % PRED: 47 %
RV: 1.4 L
TLC % PRED: 66 %
TLC: 5.12 L

## 2014-05-11 NOTE — Assessment & Plan Note (Signed)
The patient has known IPF, and has been on OFEV since July of last year without difficulty. Approximate 2 months ago he began to have profuse diarrhea along with bloating and malaise. He discontinued the medication and is now back to baseline. He feels that his breathing is a little worse than his last visit, but not by very much. His PFTs today show only a small decline in his total lung capacity from last year. I have had a long discussion with him about possibly trying OFEV again at a lower dose, [facility] involving other medications, and finally staying off anti-fibrotics and just monitoring his symptoms over time.  After discussion, the patient would like to stay off all medications, and he will work aggressively on physical conditioning. He is having issues with awakening at night with some breathing problems, and therefore I will check overnight oximetry to make sure he is not desaturating.

## 2014-05-11 NOTE — Patient Instructions (Signed)
Stay off OFEV for now Keep active and work on conditioning Will check oxygen level overnight to see if this is an issue for you Will see you back in 6mos, with chest xray on same day.

## 2014-05-11 NOTE — Progress Notes (Signed)
PFT done today. 

## 2014-05-11 NOTE — Progress Notes (Signed)
   Subjective:    Patient ID: Darrell Ware, male    DOB: 1926-08-23, 81 y.o.   MRN: [id number]  HPI The patient comes in today for follow-up of his known idiopathic pulmonary fibrosis. He has been on an anti-fibrotic since the summer, and has done well with the medication until recently. He has noticed starting in January profuse diarrhea, abdominal bloating, and general malaise. He has discontinued OFEV, and feels that he is back to his baseline. He has stayed active, but feels that his breathing may be a little shorter than last year. He has not had any significant cough. He has had PFTs today that appear fairly stable except for a slight decrease in his total lung capacity.   Review of Systems  Constitutional: Negative for fever, chills, activity change, appetite change and unexpected weight change.  HENT: Negative for congestion, dental problem, ear pain, nosebleeds, postnasal drip, rhinorrhea, sinus pressure, sneezing, sore throat, trouble swallowing and voice change.   Eyes: Negative for redness, itching and visual disturbance.  Respiratory: Positive for cough and shortness of breath. Negative for choking, chest tightness and wheezing.   Cardiovascular: Negative for chest pain, palpitations and leg swelling.  Gastrointestinal: Negative for nausea, vomiting and abdominal pain.  Genitourinary: Negative for dysuria and difficulty urinating.  Musculoskeletal: Negative for joint swelling and arthralgias.  Skin: Negative for rash.  Neurological: Negative for headaches.  Hematological: Does not bruise/bleed easily.  Psychiatric/Behavioral: Negative for behavioral problems, confusion and dysphoric mood. The patient is not nervous/anxious.        Objective:   Physical Exam  Thin male in no acute distress Nose without purulence or discharge noted Neck without lymphadenopathy or thyromegaly Chest with crackles one third the way up bilaterally Heart exam with regular rate and rhythm Lower  extremities without edema, no cyanosis Alert and oriented, moves all 4 extremities.      Assessment & Plan:

## 2014-06-12 ENCOUNTER — Telehealth: Payer: Self-pay | Admitting: Pulmonary Disease

## 2014-06-12 NOTE — Telephone Encounter (Signed)
Please let pt know that his oxygen level fell to 85% transiently during the night, but only spent 9 min the entire night less than 88%.  Does not need oxygen.  Good news.

## 2014-06-13 NOTE — Telephone Encounter (Signed)
I spoke with patient about results and he verbalized understanding and had no questions 

## 2014-07-20 ENCOUNTER — Encounter: Payer: Self-pay | Admitting: Pulmonary Disease

## 2014-08-21 ENCOUNTER — Telehealth: Payer: Self-pay | Admitting: Critical Care Medicine

## 2014-08-21 NOTE — Telephone Encounter (Signed)
Made in error

## 2014-08-23 ENCOUNTER — Telehealth: Payer: Self-pay | Admitting: Pulmonary Disease

## 2014-08-23 ENCOUNTER — Ambulatory Visit (INDEPENDENT_AMBULATORY_CARE_PROVIDER_SITE_OTHER): Payer: Medicare Other | Admitting: Pulmonary Disease

## 2014-08-23 ENCOUNTER — Ambulatory Visit (INDEPENDENT_AMBULATORY_CARE_PROVIDER_SITE_OTHER)
Admission: RE | Admit: 2014-08-23 | Discharge: 2014-08-23 | Disposition: A | Discharge status: Home / Self Care | Payer: Medicare Other | Source: Ambulatory Visit | Attending: Pulmonary Disease | Admitting: Pulmonary Disease

## 2014-08-23 ENCOUNTER — Encounter: Payer: Self-pay | Admitting: Pulmonary Disease

## 2014-08-23 VITALS — BP 146/70 | HR 81 | Ht 73.0 in | Wt 184.8 lb

## 2014-08-23 DIAGNOSIS — J9611 Chronic respiratory failure with hypoxia: Secondary | ICD-10-CM

## 2014-08-23 DIAGNOSIS — J8410 Pulmonary fibrosis, unspecified: Secondary | ICD-10-CM

## 2014-08-23 MED ORDER — NINTEDANIB ESYLATE 100 MG PO CAPS: 100.0000 mg | ORAL_CAPSULE | Freq: Two times a day (BID) | ORAL | Status: DC

## 2014-08-23 NOTE — Progress Notes (Signed)
Chief Complaint  Patient presents with  . Follow-up    Former [facility] patient : Feels IPF has worsened over the last 6 months. Pt stopped OFEV in March 2016 d/t having side effects from medication.     History of Present Illness: Darrell Ware is a 81 y.o. male with IPF.  He was previously followed by Dr. Shelle Ironlance.  He has previously been on pirfenidone >> stopped because of elevated LFTS, and Ofev >> stopped due to GI side effects.  He notices trouble with his breathing with activity.  When he rides his stationary bike he gets winded after few minutes.  He checks his pulse ox and readings can go down to as low as 70's.  This improves few minutes after he rests.  He has an occasional dry cough.  He gets chest discomfort with breathing >> this is sporadic and last for a few seconds.  He denies fever, sputum, hemoptysis, joint swelling, or skin rash.  He does not have ankle swelling.  He had ONO on RA in April.  His SaO2 was below 88% for 9 minutes >> he was told he didn't need oxygen at night.   TESTS: CT chest 05/26/11 >> diffuse, peripheral/basilar predominant interstitial reticulation with mild honeycombing, several small granulomas PFT 08/30/13 >> FEV1 3.21 (104%), FEV1% 83, TLC 5.31 (69%), DLCO 37%  Past medical hx, Past surgical hx, Medications, Allergies, Family hx, Social hx all reviewed.   Physical Exam: BP 146/70 mmHg  Pulse 81  Ht 6\' 1"  (1.854 m)  Wt 184 lb 12.8 oz (83.825 kg)  BMI 24.39 kg/m2  SpO2 96%  General - No distress, thin ENT - No sinus tenderness, no oral exudate, no LAN Cardiac - s1s2 regular, no murmur Chest - b/l crackles Back - No focal tenderness Abd - Soft, non-tender Ext - No edema Neuro - Normal strength Skin - No rashes Psych - normal mood, and behavior   Assessment/Plan:  Pulmonary fibrosis. Plan: - will give him script for Ofev at 100 mg bid - will repeat CXR today - will have him f/u with Dr. Marchelle Gearingamaswamy  Hypoxia. SpO2 on room air was  87% with ambulation >> improved with 2 liters oxygen Plan: - will arrange for supplemental oxygen - will repeat ONO on RA   Coralyn HellingVineet Sierra Spargo, MD [facility] Pager:  [phone]

## 2014-08-23 NOTE — Telephone Encounter (Signed)
Dg Chest 2 View  08/23/2014   CLINICAL DATA:  Follow-up pulmonary fibrosis. Shortness breath is getting worse.  EXAM: CHEST  2 VIEW  COMPARISON:  08/30/2012 and 05/26/2011  FINDINGS: Again noted are prominent interstitial lung markings consistent with interstitial lung disease and fibrosis. The distribution disease has not significantly changed. Slightly increased interstitial densities in the left lower lung. No large pleural effusions. No acute bone abnormality. Heart and mediastinum are stable.  IMPRESSION: Prominent interstitial lung markings are compatible with interstitial lung disease. There is concern for mild progression in the left lower lung. No large areas of airspace disease or consolidation.   Electronically Signed   By: Richarda OverlieAdam  Henn M.D.   On: 08/23/2014 15:30    Will have my nurse inform pt that CXR shows expected progression of pulmonary fibrosis compared to CXR from 2014.  No change to current tx plan.

## 2014-08-23 NOTE — Addendum Note (Signed)
Addended by: Maisie FusGREEN, ASHTYN M on: 08/23/2014 03:58 PM   Modules accepted: Orders

## 2014-08-23 NOTE — Patient Instructions (Addendum)
Will arrange for home oxygen set up Chest xray today Will arrange for repeat overnight oxygen test Ofev 100 mg twice per day Follow up in 2 weeks with Dr. Marchelle Gearingamaswamy

## 2014-08-24 NOTE — Telephone Encounter (Signed)
lmomtcb x1 

## 2014-08-24 NOTE — Telephone Encounter (Signed)
[phone], pt cb

## 2014-08-24 NOTE — Telephone Encounter (Signed)
I spoke with patient about results and he verbalized understanding and had no questions 

## 2014-09-01 ENCOUNTER — Ambulatory Visit (INDEPENDENT_AMBULATORY_CARE_PROVIDER_SITE_OTHER): Payer: Medicare Other | Admitting: Internal Medicine

## 2014-09-01 ENCOUNTER — Encounter: Payer: Self-pay | Admitting: Internal Medicine

## 2014-09-01 VITALS — BP 126/70 | HR 71 | Ht 73.0 in | Wt 184.0 lb

## 2014-09-01 DIAGNOSIS — J84112 Idiopathic pulmonary fibrosis: Secondary | ICD-10-CM

## 2014-09-01 NOTE — Patient Instructions (Addendum)
ICD-9-CM ICD-10-CM   1. Idiopathic pulmonary fibrosis 516.31 J84.112     Disease likely has progressed  PLAN - respect desire not to do followup monitoring CT chest  - please do full PFT in 4 week -[facility] device such as INNOGEN - use with exertion - await ONO study result from 08/31/14 - if abnormal - you will need o2 at sleep - refer pulmonary rehab  -TAke ofev using old supply: at 150mg  tablet once daily (old supply) - do order for OFEV 100mg  tablet twice daily - start this when you get the supply and stop above - glad you are participating in [employer] support foundation - flu shot in fall  Followup  - PFT in 4 weeks  - LFT blood test in 4 weeks on OFEV - REturn to see me or NP Tammy to review progress with Ofev -

## 2014-09-01 NOTE — Progress Notes (Signed)
Subjective:    Patient ID: Darrell Ware, male    DOB: [birth year], 81 y.o.   MRN: [id number]  HPI   OV 09/01/2014  Chief Complaint  Patient presents with  . Follow-up    Pt is a former [facility] pt. Pt states he was on OFEV but stoppped March 2016. Pt stated he rapidly declined since not being on the OFEV. When pt saw [facility] pt requested to be placed back on OFEV. Pt is currently on 150mg  BID. Pt c/o DOE, intermittent cough.    Follow-up idiopathic pulmonary fibrosis (diagnosis based on age, male sex, negative autoimmune profile and a CT scan of the chest April 2013]  This is a transfer of care. Patient is to be followed by Dr. Marcelyn BruinsKeith Clance and once by Dr. Clayton LefortV Sood for IPF. He is now following with me for Specialist IPF care. Presents with his wife both give history.  IPF diagnosed in spring of 2013. Then in the summer of 2014 through the expanded access program was on Pirfenidone but developed extremely high liver function anormlaity and had to be discontinued. Subsequently in the summer of 2015 was started on Nintedanib (Ofev) . He tolerated this well for 6 months. Then in the early part of 2016 started developing diarrhea with associated anorexia and weight loss. Symptoms are progressive. Symptoms ultimately became severe o he had abruptly discontinue the drug. Subsequently within a few days he started feeling dramatically better and all side effects resolved. However since that time he is having progressive dyspnea. He feels his interstitial lung disease is worse. A year ago overnight oxygen study and exertional pulse ox while he exercises on a recumbent bike at home showed no desaturation. However now for the last few months he is noticing desaturation into the 85% when he exercises. Correspondingly 2 days ago at our pulmonary office when he exerted he did desaturate to below 88%. He knows wants to restart his Ninetedanib ; wife wants to start him at low dose. He is currently taking old supply at the  full dose at 150 mg twice daily. He is open to attending pulmonary rehabilitation because his home exercise program has trailed off given his worsening symptoms. He is active in the pulmonary fibrosis foundation support group  His pulmonary function test within July 2015 when he was started on Ninntedanib and through March 2016 when he came off the drug have remained stable with moderate disease severity only. His FVC is 3.76 L/87%, total lung capacity is 5.12 L/66% and DLCO is 13.7/37%.       K-BILD ILD QUESTIONNAIRE, Symptom score over prior 2 weeks  7-none, 6-rarely, 5-occ, 5-some times, 3-sev times, 2-most times, 1-every time   Dyspnea for stairs, incline or hill 1  Chest Tightness 4  Worry about seriousness of lung complaint 4  Avoided doing things that make you dyspneic 4  Have you felt loss of control of lung condition (reversed from original) 5  Felt fed up due to lung condition 5  Felt urge to breathe aka air hunger 6  Has lung condition made you feel anxious 4  How often have you experienced wheezing or whistling sound 5  How much of the time have you felt your lung dz is getting worse 4  How much has your lung condition interfered with job or daily task 5  Were you expecting your lung condition to get worse 4  How much has your lung function limited you carrying things like groceris 4  How much has your lung function made you think of EOL? 5  Total 60  Are you financially worse off 5  Grand Total 65       has a past medical history of Hemorrhoid; Postural hypotension; Chronic bronchitis; RBBB (2010); Atelectasis; and Inguinal hernia.   reports that he quit smoking about 61 years ago. His smoking use included Pipe and Cigars. He does not have any smokeless tobacco history on file.  Past Surgical History  Procedure Laterality Date  . Tonsillectomy  1935  . Left arm fracture  1950  . Cataract extraction      bilat  . Retinal detachment surgery      Bilat  .  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 Vasectomy  1972    Allergies  Allergen Reactions  . Peanut-Containing Drug Products     Cough,wheezing  . Pirfenidone     Liver trouble  . Cialis [Tadalafil] Rash    Immunization History  Administered Date(s) Administered  . Influenza Split 11/25/2011, 01/02/2014  . Influenza,inj,Quad PF,36+ Mos 11/24/2012    Family History  Problem Relation Age of Onset  . Heart failure Father   . Heart failure Mother   . Macular degeneration Brother   . Macular degeneration Sister      Current outpatient prescriptions:  .  AZOPT 1 % ophthalmic suspension, Place 1 drop into the right eye 2 (two) times daily., Disp: , Rfl:  .  Beta Sitosterol 40 % POWD, 1 tablet by Does not apply route 2 (two) times daily., Disp: , Rfl:  .  Cyanocobalamin (B-12) 2500 MCG SUBL, Place under the tongue daily., Disp: , Rfl:  .  Flaxseed, Linseed, (FLAX SEED OIL PO), Take by mouth daily., Disp: , Rfl:  .  LOTEMAX 0.5 % GEL, Place 1 drop into the right eye 2 (two) times daily. , Disp: , Rfl:  .  Multiple Vitamin (MULTIVITAMIN) capsule, Take 1 capsule by mouth daily., Disp: , Rfl:  .  Nintedanib (OFEV) 100 MG CAPS, Take 100 mg by mouth 2 (two) times daily. (Patient taking differently: Take 150 mg by mouth 2 (two) times daily. ), Disp: 60 capsule, Rfl: 1 .  NON FORMULARY, [place] supplement twice daily, Disp: , Rfl:  .  ZIOPTAN 0.0015 % SOLN, Place 1 drop into the right eye daily., Disp: , Rfl:     Review of Systems  Constitutional: Negative for fever and unexpected weight change.  HENT: Negative for congestion, dental problem, ear pain, nosebleeds, postnasal drip, rhinorrhea, sinus pressure, sneezing, sore throat and trouble swallowing.   Eyes: Negative for redness and itching.  Respiratory: Positive for cough and shortness of breath. Negative for chest tightness and wheezing.   Cardiovascular: Negative for palpitations and leg swelling.  Gastrointestinal: Negative for  nausea and vomiting.  Genitourinary: Negative for dysuria.  Musculoskeletal: Negative for joint swelling.  Skin: Negative for rash.  Neurological: Negative for headaches.  Hematological: Does not bruise/bleed easily.  Psychiatric/Behavioral: Negative for dysphoric mood. The patient is not nervous/anxious.        Objective:   Physical Exam  Constitutional: He is oriented to person, place, and time. He appears well-developed and well-nourished. No distress.  HENT:  Head: Normocephalic and atraumatic.  Right Ear: External ear normal.  Left Ear: External ear normal.  Mouth/Throat: Oropharynx is clear and moist. No oropharyngeal exudate.  Eyes: Conjunctivae and EOM are normal. Pupils are equal, round, and reactive to light. Right eye exhibits no discharge. Left  eye exhibits no discharge. No scleral icterus.  Neck: Normal range of motion. Neck supple. No JVD present. No tracheal deviation present. No thyromegaly present.  Cardiovascular: Normal rate, regular rhythm and intact distal pulses.  Exam reveals no gallop and no friction rub.   No murmur heard. Pulmonary/Chest: Effort normal. No respiratory distress. He has no wheezes. He has rales. He exhibits no tenderness.  Basal crackles +  Abdominal: Soft. Bowel sounds are normal. He exhibits no distension and no mass. There is no tenderness. There is no rebound and no guarding.  Musculoskeletal: Normal range of motion. He exhibits no edema or tenderness.  Lymphadenopathy:    He has no cervical adenopathy.  Neurological: He is alert and oriented to person, place, and time. He has normal reflexes. No cranial nerve deficit. Coordination normal.  Skin: Skin is warm and dry. No rash noted. He is not diaphoretic. No erythema. No pallor.  Psychiatric: He has a normal mood and affect. His behavior is normal. Judgment and thought content normal.  Nursing note and vitals reviewed.   Filed Vitals:   09/01/14 1128  BP: 126/70  Pulse: 71  Height: 6'  1" (1.854 m)  Weight: 184 lb (83.462 kg)  SpO2: 97%         Assessment & Plan:     ICD-9-CM ICD-10-CM   1. Idiopathic pulmonary fibrosis 516.31 J84.112 Pulmonary Function Test     Ambulatory Referral for DME    Disease likely has progressed  PLAN - respect desire not to do followup monitoring CT chest  - please do full PFT in 4 week -meet with [facility] to get O2 portable device such as INNOGEN - use with exertion - await ONO study result from 08/31/14 - if abnormal - you will need o2 at sleep - refer pulmonary rehab  -TAke ofev using old supply: at  tablet once daily (old supply) - do order for OFEV  tablet twice daily - start this when you get the supply and stop above - glad you are participating in PFF support foundation - flu shot in fall  Followup  - PFT in 4 weeks  - LFT blood test in 4 weeks on OFEV - REturn to see me or NP Tammy to review progress with Ofev -    > 50% of this > 25 min visit spent in face to face counseling or coordination of care    [name], M.D., [facility]C.P Pulmonary and Critical Care Medicine Staff Physician [facility] Pager: [phone], If no answer or between  15:00h - 7:00h: call [phone]  09/01/2014 12:08 PM

## 2014-09-04 ENCOUNTER — Telehealth: Payer: Self-pay | Admitting: Internal Medicine

## 2014-09-04 MED ORDER — NINTEDANIB ESYLATE 100 MG PO CAPS: 100.0000 mg | ORAL_CAPSULE | Freq: Two times a day (BID) | ORAL | Status: DC

## 2014-09-04 NOTE — Telephone Encounter (Signed)
Spoke with pt. He needed his ofev 100 mg sent to [facility]. I have done so. Nothing further needed

## 2014-09-17 ENCOUNTER — Telehealth: Payer: Self-pay | Admitting: Pulmonary Disease

## 2014-09-17 NOTE — Telephone Encounter (Signed)
Thanks Vineet. Close message. No need to rep;ly

## 2014-09-17 NOTE — Telephone Encounter (Signed)
ONO with RA 09/01/14 >> test time 8 hrs 30 min.  Baseline SpO2 93.3%, low SpO2 86%.  Spent 44 sec with SpO2 < 88%.   Will have my nurse inform pt that ONO looked good.  He does not need to use oxygen at night at this time.   Will also route message to Dr. Marchelle Gearing to be aware of ONO results.

## 2014-09-18 NOTE — Telephone Encounter (Signed)
Results have been explained to patient, pt expressed understanding. Nothing further needed.  

## 2014-09-26 ENCOUNTER — Other Ambulatory Visit: Payer: Self-pay | Admitting: Internal Medicine

## 2014-09-26 DIAGNOSIS — J84112 Idiopathic pulmonary fibrosis: Secondary | ICD-10-CM

## 2014-09-29 ENCOUNTER — Ambulatory Visit (INDEPENDENT_AMBULATORY_CARE_PROVIDER_SITE_OTHER): Payer: Medicare Other | Admitting: Internal Medicine

## 2014-09-29 ENCOUNTER — Other Ambulatory Visit (INDEPENDENT_AMBULATORY_CARE_PROVIDER_SITE_OTHER): Payer: Medicare Other

## 2014-09-29 ENCOUNTER — Encounter: Payer: Self-pay | Admitting: Adult Health

## 2014-09-29 ENCOUNTER — Ambulatory Visit (INDEPENDENT_AMBULATORY_CARE_PROVIDER_SITE_OTHER): Payer: Medicare Other | Admitting: Adult Health

## 2014-09-29 VITALS — BP 120/68 | HR 70 | Temp 98.1°F | Ht 75.0 in | Wt 184.0 lb

## 2014-09-29 DIAGNOSIS — J9611 Chronic respiratory failure with hypoxia: Secondary | ICD-10-CM | POA: Diagnosis not present

## 2014-09-29 DIAGNOSIS — J84112 Idiopathic pulmonary fibrosis: Secondary | ICD-10-CM

## 2014-09-29 DIAGNOSIS — J9610 Chronic respiratory failure, unspecified whether with hypoxia or hypercapnia: Secondary | ICD-10-CM | POA: Insufficient documentation

## 2014-09-29 LAB — HEPATIC FUNCTION PANEL
ALBUMIN: 4.1 g/dL (ref 3.5–5.2)
ALT: 16 U/L (ref 0–53)
AST: 15 U/L (ref 0–37)
Alkaline Phosphatase: 52 U/L (ref 39–117)
BILIRUBIN DIRECT: 0.1 mg/dL (ref 0.0–0.3)
TOTAL PROTEIN: 7.6 g/dL (ref 6.0–8.3)
Total Bilirubin: 0.4 mg/dL (ref 0.2–1.2)

## 2014-09-29 LAB — PULMONARY FUNCTION TEST
DL/VA % pred: 55 %
DL/VA: 2.63 ml/min/mmHg/L
DLCO UNC: 13.83 ml/min/mmHg
DLCO unc % pred: 38 %
FEF 25-75 Post: 3.43 L/sec
FEF 25-75 Pre: 2.82 L/sec
FEF2575-%Change-Post: 21 %
FEF2575-%Pred-Post: 174 %
FEF2575-%Pred-Pre: 143 %
FEV1-%CHANGE-POST: 3 %
FEV1-%PRED-PRE: 100 %
FEV1-%Pred-Post: 103 %
FEV1-Post: 3.13 L
FEV1-Pre: 3.02 L
FEV1FVC-%Change-Post: 3 %
FEV1FVC-%PRED-PRE: 113 %
FEV6-%CHANGE-POST: 0 %
FEV6-%Pred-Post: 94 %
FEV6-%Pred-Pre: 94 %
FEV6-Post: 3.8 L
FEV6-Pre: 3.77 L
FEV6FVC-%Change-Post: 0 %
FEV6FVC-%PRED-POST: 107 %
FEV6FVC-%Pred-Pre: 106 %
FVC-%Change-Post: 0 %
FVC-%Pred-Post: 88 %
FVC-%Pred-Pre: 88 %
FVC-POST: 3.81 L
FVC-Pre: 3.8 L
POST FEV6/FVC RATIO: 100 %
PRE FEV1/FVC RATIO: 80 %
PRE FEV6/FVC RATIO: 99 %
Post FEV1/FVC ratio: 82 %
RV % PRED: 48 %
RV: 1.41 L
TLC % pred: 70 %
TLC: 5.45 L

## 2014-09-29 NOTE — Assessment & Plan Note (Signed)
Continue on oxygen with activity 

## 2014-09-29 NOTE — Assessment & Plan Note (Signed)
Compensated without flare  PFT is stable. No significant change in diffusing capacity, Patient will continue on his current therapy Check LFTs.  Plan  Continue on current regimen  LFT today  follow up Dr. Marchelle Gearing in 2 months and As needed

## 2014-09-29 NOTE — Patient Instructions (Signed)
Continue on current regimen  LFT today  follow up Dr. Marchelle Gearing in 2 months and As needed

## 2014-09-29 NOTE — Progress Notes (Signed)
PFT done today. 

## 2014-09-29 NOTE — Progress Notes (Signed)
Quick Note:  Called and spoke with pt. Reviewed results and recs. Pt voiced understanding and had no further questions. ______ 

## 2014-09-29 NOTE — Progress Notes (Signed)
Subjective:    Patient ID: Darrell Ware, male    DOB: 1926/12/29, 81 y.o.   MRN: [id number]  HPI   OV 09/01/2014  Chief Complaint  Patient presents with  . Follow-up    Pt is a former [facility] pt. Pt states he was on OFEV but stoppped March 2016. Pt stated he rapidly declined since not being on the OFEV. When pt saw [facility] pt requested to be placed back on OFEV. Pt is currently on  BID. Pt c/o DOE, intermittent cough.    Follow-up idiopathic pulmonary fibrosis (diagnosis based on age, male sex, negative autoimmune profile and a CT scan of the chest April 2013]  This is a transfer of care. Patient is to be followed by Dr. Marcelyn Bruins and once by Dr. Clayton Lefort for IPF. He is now following with me for Specialist IPF care. Presents with his wife both give history.  IPF diagnosed in spring of 2013. Then in the summer of 2014 through the expanded access program was on Pirfenidone but developed extremely high liver function anormlaity and had to be discontinued. Subsequently in the summer of 2015 was started on Nintedanib (Ofev) . He tolerated this well for 6 months. Then in the early part of 2016 started developing diarrhea with associated anorexia and weight loss. Symptoms are progressive. Symptoms ultimately became severe o he had abruptly discontinue the drug. Subsequently within a few days he started feeling dramatically better and all side effects resolved. However since that time he is having progressive dyspnea. He feels his interstitial lung disease is worse. A year ago overnight oxygen study and exertional pulse ox while he exercises on a recumbent bike at home showed no desaturation. However now for the last few months he is noticing desaturation into the 85% when he exercises. Correspondingly 2 days ago at our pulmonary office when he exerted he did desaturate to below 88%. He knows wants to restart his Ninetedanib ; wife wants to start him at low dose. He is currently taking old supply at the  full dose at 150 mg twice daily. He is open to attending pulmonary rehabilitation because his home exercise program has trailed off given his worsening symptoms. He is active in the pulmonary fibrosis foundation support group  His pulmonary function test within July 2015 when he was started on Ninntedanib and through March 2016 when he came off the drug have remained stable with moderate disease severity only. His FVC is 3.76 L/87%, total lung capacity is 5.12 L/66% and DLCO is 13.7/37%.   09/29/2014 Follow up : IPF and Chronic Hypoxic Resp Failure  Patient returns for a 4 week follow-up Patient was seen last visit with change of his pulmonary fibrosis medicine, OFEV , to lower dose to help with potential GI side effects. He is currently on 100 mg twice daily. He says he is tolerating well. Does have some mild nausea, but is tolerable. We discussed getting lab work today with LFTs. Patient was also started on oxygen with activity. He says this is really helped with his exercise regimen. He is waiting on his portable concentrator to be delivered. Order was checked on through his DME company today and has been approved. He has been referred to pulmonary rehabilitation, he is currently waiting for call for initiation of program. PFTs were repeated today that are similar to 2013. FVC 88%, FEV100%, ratio 80, diffusing capacity 38%. Patient says overall he feels that he is improved. He does get fatigued but feels that  this is slightly improved. He tries to exercise daily. He travels quite a bit with his wife. Is planning a trip to [place] in February.    Review of Systems  Constitutional: Negative for fever and unexpected weight change.  HENT: Negative for congestion, dental problem, ear pain, nosebleeds, postnasal drip, rhinorrhea, sinus pressure, sneezing, sore throat and trouble swallowing.   Eyes: Negative for redness and itching.  Respiratory: Positive for cough and shortness of breath. Negative  for chest tightness and wheezing.   Cardiovascular: Negative for palpitations and leg swelling.  Gastrointestinal: Negative for nausea and vomiting.  Genitourinary: Negative for dysuria.  Musculoskeletal: Negative for joint swelling.  Skin: Negative for rash.  Neurological: Negative for headaches.  Hematological: Does not bruise/bleed easily.  Psychiatric/Behavioral: Negative for dysphoric mood. The patient is not nervous/anxious.        Objective:   Physical Exam  GEN: A/Ox3; pleasant , NAD, thin tall male   HEENT:  Halls/AT,  EACs-clear, TMs-wnl, NOSE-clear, THROAT-clear, no lesions, no postnasal drip or exudate noted.   NECK:  Supple w/ fair ROM; no JVD; normal carotid impulses w/o bruits; no thyromegaly or nodules palpated; no lymphadenopathy.  RESP  Very faint bibasilar crackles , no accessory muscle use, no dullness to percussion  CARD:  RRR, no m/r/g  , no peripheral edema, pulses intact, no cyanosis or clubbing.  GI:   Soft & nt; nml bowel sounds; no organomegaly or masses detected.  Musco: Warm bil, no deformities or joint swelling noted.   Neuro: alert, no focal deficits noted.    Skin: Warm, no lesions or rashes

## 2014-10-17 ENCOUNTER — Encounter: Payer: Self-pay | Admitting: Pulmonary Disease

## 2014-11-02 ENCOUNTER — Telehealth (HOSPITAL_COMMUNITY): Payer: Self-pay

## 2014-11-02 NOTE — Telephone Encounter (Signed)
Called patient regarding entrance to Pulmonary Rehab.  Patient states that they are interested in attending the program.  Darrell Ware is going to verify insurance coverage and follow up.

## 2014-11-07 ENCOUNTER — Other Ambulatory Visit: Payer: Self-pay | Admitting: *Deleted

## 2014-11-07 MED ORDER — NINTEDANIB ESYLATE 100 MG PO CAPS: 100.0000 mg | ORAL_CAPSULE | Freq: Two times a day (BID) | ORAL | Status: DC

## 2014-11-07 NOTE — Telephone Encounter (Signed)
RX sent to [facility] for OFEV Nothing further needed.

## 2014-11-13 ENCOUNTER — Encounter (HOSPITAL_COMMUNITY): Payer: Self-pay

## 2014-11-13 ENCOUNTER — Encounter (HOSPITAL_COMMUNITY)
Admission: RE | Admit: 2014-11-13 | Discharge: 2014-11-13 | Disposition: A | Discharge status: Home / Self Care | Payer: Medicare Other | Source: Ambulatory Visit | Attending: Internal Medicine | Admitting: Internal Medicine

## 2014-11-13 VITALS — BP 130/70 | HR 77

## 2014-11-13 DIAGNOSIS — J8410 Pulmonary fibrosis, unspecified: Secondary | ICD-10-CM | POA: Diagnosis present

## 2014-11-13 NOTE — Progress Notes (Signed)
Darrell Ware 81 y.o. male Pulmonary Rehab Orientation Note Patient arrived today in Cardiac and Pulmonary Rehab for orientation to Pulmonary Rehab. He walked from [employer] without difficulty.  He took several rest breaks.  He does carry portable oxygen when he exercises or is performing streunous activities.   Per pt, he uses oxygen intermittently. Color good, skin warm and dry. Patient is oriented to time and place. Patient's medical history and medications reviewed. Heart rate is normal, breath sounds clear to auscultation, no wheezes, rales, or rhonchi. Grip strength equal, strong. Distal pulses 3+ bilateral posterior tibial pulses present.  Patient reports he  does take medications as prescribed. Patient states he follows a vegetarian diet. The patient reports no specific efforts to gain or lose weight.  Patient's weight will be monitored closely. Demonstration and practice of PLB using pulse oximeter. Patient able to return demonstration satisfactorily. Safety and hand hygiene in the exercise area reviewed with patient. Patient voices understanding of the information reviewed. Department expectations discussed with patient and achievable goals were set. Patient scored a "1" on the PHQ 2 depression scale and does not exhibit signs or symptoms of depression. He seems to have good coping skills with his diagnosis of pulmonary fibrosis.  He has noticed this last year has affected [place] ability [place]  The hills have prevented him from cycling and has switched to a stationary schwinn airdyne bicycle which he uses 3-5 days per week with pulsed oxygen.  He seems to be at a high level of conditioning and we look forward to improving that as well.  The patient shows enthusiasm about attending the program and we look forward to working with this nice gentleman. The patient is scheduled for a 6 min walk test on Tuesday, November 14, 2014 @ 4pm and to begin exercise on  Thursday, November 16, 2014 in the 1030 class.   [id number]

## 2014-11-14 ENCOUNTER — Encounter (HOSPITAL_COMMUNITY)
Admission: RE | Admit: 2014-11-14 | Discharge: 2014-11-14 | Disposition: A | Discharge status: Home / Self Care | Payer: Medicare Other | Source: Ambulatory Visit | Attending: Internal Medicine | Admitting: Internal Medicine

## 2014-11-14 DIAGNOSIS — J8410 Pulmonary fibrosis, unspecified: Secondary | ICD-10-CM | POA: Diagnosis not present

## 2014-11-14 NOTE — Progress Notes (Signed)
Darrell Ware completed a Six-Minute Walk Test on 11/14/14 . Leobardo walked 1229 feet with 0 breaks.  Darrell Ware's lowest oxygen saturation was 85 %, highest heart rate was 127 bpm , and highest blood pressure was 140/66. Darrell Ware was on 3 liters of oxygen with a nasal cannula. Ware stated that nothing hindered their walk test.

## 2014-11-16 ENCOUNTER — Encounter (HOSPITAL_COMMUNITY)
Admission: RE | Admit: 2014-11-16 | Discharge: 2014-11-16 | Disposition: A | Discharge status: Home / Self Care | Payer: Medicare Other | Source: Ambulatory Visit | Attending: Internal Medicine | Admitting: Internal Medicine

## 2014-11-16 DIAGNOSIS — J8410 Pulmonary fibrosis, unspecified: Secondary | ICD-10-CM | POA: Diagnosis not present

## 2014-11-16 NOTE — Progress Notes (Signed)
Today, Darrell Ware exercised at [employer]. Service time was from 10:30am to 12:30pm.  The patient exercised by performing aerobic, strengthening, and stretching exercises. Oxygen saturation, heart rate, blood pressure, rate of perceived exertion, and shortness of breath were all monitored before, during, and after exercise. Darrell Ware presented with no problems at today's exercise session. The patient attended education today with RN [name] on Signs and Symptoms.  The patient did not have an increase in workload intensity during today's exercise session.  Pre-exercise vitals: . Weight kg: 83.2 . Liters of O2: ra . SpO2: 94 . HR: 71 . BP: 120/56 . CBG: na  Exercise vitals: . Highest heartrate:  102 . Lowest oxygen saturation: 88 . Highest blood pressure: 162/78 . Liters of 02: 2  Post-exercise vitals: . SpO2: 95 . HR: 78 . BP: 124/64 . Liters of O2: 2 . CBG: na  [name], Medical Director Dr. Catha Gosselin is immediately available during today's Pulmonary Rehab session for [facility] on 11/16/14 at 10:30am class time.

## 2014-11-17 ENCOUNTER — Telehealth: Payer: Self-pay | Admitting: Internal Medicine

## 2014-11-17 MED ORDER — NINTEDANIB ESYLATE 100 MG PO CAPS: 100.0000 mg | ORAL_CAPSULE | Freq: Two times a day (BID) | ORAL | Status: DC

## 2014-11-17 NOTE — Telephone Encounter (Signed)
Called [facility]. They never received RX from 11/07/14.  Spoke with a pharm and gave VO for OFEV. They will get this processed.  Called made pt aware. Nothing further needed

## 2014-11-21 ENCOUNTER — Encounter (HOSPITAL_COMMUNITY)
Admission: RE | Admit: 2014-11-21 | Discharge: 2014-11-21 | Disposition: A | Discharge status: Home / Self Care | Payer: Medicare Other | Source: Ambulatory Visit | Attending: Internal Medicine | Admitting: Internal Medicine

## 2014-11-21 DIAGNOSIS — J8410 Pulmonary fibrosis, unspecified: Secondary | ICD-10-CM | POA: Diagnosis not present

## 2014-11-21 NOTE — Progress Notes (Signed)
Today, Darrell Ware exercised at [employer]. Service time was from 10:30am to 12:15pm.  The patient exercised by performing aerobic, strengthening, and stretching exercises. Oxygen saturation, heart rate, blood pressure, rate of perceived exertion, and shortness of breath were all monitored before, during, and after exercise. Darrell Ware presented with no problems at today's exercise session.  The patient did not have an increase in workload intensity during today's exercise session.  Pre-exercise vitals: . Weight kg: 83.5 . Liters of O2: ra . SpO2: 96 . HR: 62 . BP: 102/58 . CBG: na  Exercise vitals: . Highest heartrate:  98 . Lowest oxygen saturation: 92 . Highest blood pressure: 136/80 . Liters of 02: 2  Post-exercise vitals: . SpO2: 95 . HR: 75 . BP: 122/60 . Liters of O2: ra . CBG: na  [name], Medical Director Dr. Catha Gosselin is immediately available during today's Pulmonary Rehab session for [facility] on 11/21/14 at 10:30a class time.

## 2014-11-23 ENCOUNTER — Encounter (HOSPITAL_COMMUNITY)
Admission: RE | Admit: 2014-11-23 | Discharge: 2014-11-23 | Disposition: A | Discharge status: Home / Self Care | Payer: Medicare Other | Source: Ambulatory Visit | Attending: Internal Medicine | Admitting: Internal Medicine

## 2014-11-23 DIAGNOSIS — J8410 Pulmonary fibrosis, unspecified: Secondary | ICD-10-CM | POA: Diagnosis not present

## 2014-11-23 NOTE — Progress Notes (Signed)
Today, [name] exercised at [facility]rell [facility]. [facility]rell [facility]. Service time was from 1030 to 1240.  The patient exercised by performing aerobic, strengthening, and stretching exercises. Oxygen saturation, heart rate, blood pressure, rate of perceived exertion, and shortness of breath were all monitored before, during, and after exercise. Darrell Ware presented with no problems at today's exercise session. He attended Anatomy and Physiology class today.  The patient did have an increase in workload intensity during today's exercise session.  Pre-exercise vitals: . Weight kg: 84.0 . Liters of O2: RA . SpO2: 97 . HR: 82 . BP: 130/64 . CBG: NA  Exercise vitals: . Highest heartrate:  96 . Lowest oxygen saturation: 94 . Highest blood pressure: 120/70 . Liters of 02: 2  Post-exercise vitals: . SpO2: 96 . HR: 80 . BP: 118/60 . Liters of O2: RA . CBG: NA [name], Medical Director Dr. Arbutus Leas is immediately available during today's Pulmonary [facility] session for [facility] on 11/23/2014  at 1030 class time.  Marland Kitchen

## 2014-11-24 ENCOUNTER — Ambulatory Visit (INDEPENDENT_AMBULATORY_CARE_PROVIDER_SITE_OTHER): Payer: Medicare Other | Admitting: Internal Medicine

## 2014-11-24 ENCOUNTER — Encounter: Payer: Self-pay | Admitting: Internal Medicine

## 2014-11-24 VITALS — BP 134/82 | HR 78 | Ht 75.0 in | Wt 182.0 lb

## 2014-11-24 DIAGNOSIS — J84112 Idiopathic pulmonary fibrosis: Secondary | ICD-10-CM | POA: Diagnosis not present

## 2014-11-24 NOTE — Patient Instructions (Signed)
ICD-9-CM ICD-10-CM   1. Idiopathic pulmonary fibrosis 516.31 J84.112     Clinically stable Do KBILD question Continue ofev at lower dose of  twice daily Do LFT after thanksigivng 2016  Followup REturn to see me early-mid Jan 2017 ahead of [place] trip 03/17/15

## 2014-11-24 NOTE — Progress Notes (Signed)
Subjective:    Patient ID: Darrell Ware, male    DOB: August 25, 1926, 81 y.o.   MRN: [id number]  HPI   OV 09/01/2014  Chief Complaint  Patient presents with  . Follow-up    Pt is a former [facility] pt. Pt states he was on OFEV but stoppped March 2016. Pt stated he rapidly declined since not being on the OFEV. When pt saw [facility] pt requested to be placed back on OFEV. Pt is currently on  BID. Pt c/o DOE, intermittent cough.    Follow-up idiopathic pulmonary fibrosis (diagnosis based on age, male sex, negative autoimmune profile and a CT scan of the chest April 2013]  This is a transfer of care. Patient is to be followed by Dr. Marcelyn Bruins and once by Dr. Clayton Lefort for IPF. He is now following with me for Specialist IPF care. Presents with his wife both give history.  IPF diagnosed in spring of 2013. Then in the summer of 2014 through the expanded access program was on Pirfenidone but developed extremely high liver function anormlaity and had to be discontinued. Subsequently in the summer of 2015 was started on Nintedanib (Ofev) . He tolerated this well for 6 months. Then in the early part of 2016 started developing diarrhea with associated anorexia and weight loss. Symptoms are progressive. Symptoms ultimately became severe o he had abruptly discontinue the drug. Subsequently within a few days he started feeling dramatically better and all side effects resolved. However since that time he is having progressive dyspnea. He feels his interstitial lung disease is worse. A year ago overnight oxygen study and exertional pulse ox while he exercises on a recumbent bike at home showed no desaturation. However now for the last few months he is noticing desaturation into the 85% when he exercises. Correspondingly 2 days ago at our pulmonary office when he exerted he did desaturate to below 88%. He knows wants to restart his Ninetedanib ; wife wants to start him at low dose. He is currently taking old supply at the  full dose at 150 mg twice daily. He is open to attending pulmonary rehabilitation because his home exercise program has trailed off given his worsening symptoms. He is active in the pulmonary fibrosis foundation support group  His pulmonary function test within July 2015 when he was started on Ninntedanib and through March 2016 when he came off the drug have remained stable with moderate disease severity only. His FVC is 3.76 L/87%, total lung capacity is 5.12 L/66% and DLCO is 13.7/37%.    09/29/2014 Follow up : IPF and Chronic Hypoxic Resp Failure  Patient returns for a 4 week follow-up Patient was seen last visit with change of his pulmonary fibrosis medicine, OFEV , to lower dose to help with potential GI side effects. He is currently on 100 mg twice daily. He says he is tolerating well. Does have some mild nausea, but is tolerable. We discussed getting lab work today with LFTs. Patient was also started on oxygen with activity. He says this is really helped with his exercise regimen. He is waiting on his portable concentrator to be delivered. Order was checked on through his DME company today and has been approved. He has been referred to pulmonary rehabilitation, he is currently waiting for call for initiation of program. PFTs were repeated today that are similar to 2013. FVC 88%, FEV100%, ratio 80, diffusing capacity 38%. Patient says overall he feels that he is improved. He does get fatigued but feels  that this is slightly improved. He tries to exercise daily. He travels quite a bit with his wife. Is planning a trip to [place] in February.    OV 11/24/2014   Chief Complaint  Patient presents with  . Follow-up    pt. states breathing is baseline. SOB at baseline. dry cough is baseline. denies chest pain/tightness.   Follow-up idiopathic pulmonary fibrosis - on low dose ofev due to gi side effects   He presents with his wife. He is tolerating the low-dose of Ofev quite well. Does not  have any GI side effects. Overall he and his wife attest that his pulmonary health is stable. Looking at the interstitial lung disease questionnaire below it is obvious that he has significant dyspnea when he climbs stairs or hill but otherwise is a high performing individual. He is planning a trip in [place] in January this is an annual winter trip where he goes for 2 weeks. Otherwise no new problems. He was attending the pulmonary fibrosis foundation support group yesterday. He uses oxygen with exertion   Last the function test August 2016 was normal this is for monitoring on Ofev which is needed every 3 months  K-BILD ILD QUESTIONNAIRE, Symptom score over prior 2 weeks  7-none, 6-rarely, 5-occ, 5-some times, 3-sev times, 2-most times, 1-every time 11/24/2014   Dyspnea for stairs, incline or hill 2  Chest Tightness 6  Worry about seriousness of lung complaint 5  Avoided doing things that make you dyspneic 5  Have you felt loss of control of lung condition (reversed from original) 6  Felt fed up due to lung condition 5  Felt urge to breathe aka air hunger 6  Has lung condition made you feel anxious 6  How often have you experienced wheezing or whistling sound 6  How much of the time have you felt your lung dz is getting worse 7  How much has your lung condition interfered with job or daily task 5  Were you expecting your lung condition to get worse 7  How much has your lung function limited you carrying things like groceris 6  How much has your lung function made you think of EOL? 6  Total 78  Are you financially worse off 6  Grand Total 84      Current outpatient prescriptions:  .  AZOPT 1 % ophthalmic suspension, Place 1 drop into the right eye 3 (three) times daily. , Disp: , Rfl:  .  Beta Sitosterol 40 % POWD, 1 tablet by Does not apply route 2 (two) times daily., Disp: , Rfl:  .  Cyanocobalamin (B-12) 2500 MCG SUBL, Place under the tongue daily., Disp: , Rfl:  .  Flaxseed,  Linseed, (FLAX SEED OIL PO), Take by mouth daily., Disp: , Rfl:  .  LOTEMAX 0.5 % GEL, Place 1 drop into the right eye 2 (two) times daily. , Disp: , Rfl:  .  Multiple Vitamin (MULTIVITAMIN) capsule, Take 1 capsule by mouth daily., Disp: , Rfl:  .  Nintedanib (OFEV) 100 MG CAPS, Take 100 mg by mouth 2 (two) times daily., Disp: 60 capsule, Rfl: 6 .  NON FORMULARY, Shaklee supplement twice daily, Disp: , Rfl:  .  ZIOPTAN 0.0015 % SOLN, Place 1 drop into the right eye daily., Disp: , Rfl:   Immunization History  Administered Date(s) Administered  . Influenza Split 11/25/2011, 01/02/2014  . Influenza,inj,Quad PF,36+ Mos 11/24/2012      Review of Systems Positive for shortness of breath and mild  cough otherwise per history of present illness and the table above.    Objective:   Physical Exam  Constitutional: He is oriented to person, place, and time. He appears well-developed and well-nourished. No distress.  HENT:  Head: Normocephalic and atraumatic.  Right Ear: External ear normal.  Left Ear: External ear normal.  Mouth/Throat: Oropharynx is clear and moist. No oropharyngeal exudate.  Eyes: Conjunctivae and EOM are normal. Pupils are equal, round, and reactive to light. Right eye exhibits no discharge. Left eye exhibits no discharge. No scleral icterus.  Neck: Normal range of motion. Neck supple. No JVD present. No tracheal deviation present. No thyromegaly present.  Cardiovascular: Normal rate, regular rhythm and intact distal pulses.  Exam reveals no gallop and no friction rub.   No murmur heard. Pulmonary/Chest: Effort normal. No respiratory distress. He has no wheezes. He has rales. He exhibits no tenderness.  Abdominal: Soft. Bowel sounds are normal. He exhibits no distension and no mass. There is no tenderness. There is no rebound and no guarding.  Musculoskeletal: Normal range of motion. He exhibits no edema or tenderness.  Lymphadenopathy:    He has no cervical adenopathy.    Neurological: He is alert and oriented to person, place, and time. He has normal reflexes. No cranial nerve deficit. Coordination normal.  Skin: Skin is warm and dry. No rash noted. He is not diaphoretic. No erythema. No pallor.  Psychiatric: He has a normal mood and affect. His behavior is normal. Judgment and thought content normal.  Nursing note and vitals reviewed.   Filed Vitals:   11/24/14 1653  BP: 134/82  Pulse: 78  Height:  (1.905 m)  Weight: 182 lb (82.555 kg)  SpO2: 97%    97% room air at rest      Assessment & Plan:     ICD-9-CM ICD-10-CM   1. Idiopathic pulmonary fibrosis 516.31 J84.112     Clinically stable Continue ofev at lower dose of  twice daily Do LFT after thanksigivng 2016 He will have his flu shot with his primary care physician  Followup REturn to see me early-mid Jan 2017 ahead of [place] trip 03/17/15  [name], M.D., [facility].C.P Pulmonary and Critical Care Medicine Staff Physician [facility] [place] [facility] Pager: [phone], If no answer or between  15:00h - 7:00h: call [phone]  11/26/2014 3:02 PM

## 2014-11-28 ENCOUNTER — Encounter (HOSPITAL_COMMUNITY)
Admission: RE | Admit: 2014-11-28 | Discharge: 2014-11-28 | Disposition: A | Discharge status: Home / Self Care | Payer: Medicare Other | Source: Ambulatory Visit | Attending: Internal Medicine | Admitting: Internal Medicine

## 2014-11-28 DIAGNOSIS — J8410 Pulmonary fibrosis, unspecified: Secondary | ICD-10-CM | POA: Diagnosis not present

## 2014-11-28 NOTE — Progress Notes (Signed)
Today, Darrell Ware exercised at [employer]. Service time was from 1030 to 1220.  The patient exercised by performing aerobic, strengthening, and stretching exercises. Oxygen saturation, heart rate, blood pressure, rate of perceived exertion, and shortness of breath were all monitored before, during, and after exercise. Darrell Ware presented with no problems at today's exercise session.  The patient did have an increase in workload intensity during today's exercise session.  Pre-exercise vitals: . Weight kg: 83.7 . Liters of O2: ra . SpO2: 94 . HR: 74 . BP: 118/54 . CBG: na  Exercise vitals: . Highest heartrate:  126 . Lowest oxygen saturation: 83 increased to 91 with rest break and PLB . Highest blood pressure: 106/62 . Liters of 02: 2L increased to 4L  Post-exercise vitals: . SpO2: 96 . HR: 81 . BP: 110/56 . Liters of O2: ra . CBG: na  [name], Medical Director Dr. Susie Cassette is immediately available during today's Pulmonary Rehab session for [name] on 11/28/2014 at 1030 class time

## 2014-11-30 ENCOUNTER — Encounter (HOSPITAL_COMMUNITY)
Admission: RE | Admit: 2014-11-30 | Discharge: 2014-11-30 | Disposition: A | Discharge status: Home / Self Care | Payer: Medicare Other | Source: Ambulatory Visit | Attending: Internal Medicine | Admitting: Internal Medicine

## 2014-11-30 DIAGNOSIS — J8410 Pulmonary fibrosis, unspecified: Secondary | ICD-10-CM | POA: Diagnosis not present

## 2014-11-30 NOTE — Progress Notes (Signed)
Today, Darrell Ware exercised at [employer]. Service time was from 1030 to 1230.  The patient exercised by performing aerobic, strengthening, and stretching exercises. Oxygen saturation, heart rate, blood pressure, rate of perceived exertion, and shortness of breath were all monitored before, during, and after exercise. Darrell Ware presented with no problems at today's exercise session. Patient attended Nutrition for the Pulmonary Patient class today.   The patient did not have an increase in workload intensity during today's exercise session.  Pre-exercise vitals: . Weight kg: 83.7 . Liters of O2: ra . SpO2: 96 . HR: 68 . BP: 120/62 . CBG: na  Exercise vitals: . Highest heartrate:  121 . Lowest oxygen saturation: 88 . Highest blood pressure: 130/66 . Liters of 02: 4  Post-exercise vitals: . SpO2: 95 . HR: 82 . BP: 122/72 . Liters of O2: ra . CBG: na [name], Medical Director Dr. Susie Cassette is immediately available during today's Pulmonary Rehab session for [facility] on 11/30/2014  at 1030 class time  .

## 2014-12-05 ENCOUNTER — Encounter (HOSPITAL_COMMUNITY)
Admission: RE | Admit: 2014-12-05 | Discharge: 2014-12-05 | Disposition: A | Discharge status: Home / Self Care | Payer: Medicare Other | Source: Ambulatory Visit | Attending: Internal Medicine | Admitting: Internal Medicine

## 2014-12-05 DIAGNOSIS — J8410 Pulmonary fibrosis, unspecified: Secondary | ICD-10-CM | POA: Diagnosis not present

## 2014-12-05 NOTE — Progress Notes (Signed)
Today, Darrell Ware exercised at [employer]. Service time was from 10:30am to 12:00pm.  The patient exercised by performing aerobic, strengthening, and stretching exercises. Oxygen saturation, heart rate, blood pressure, rate of perceived exertion, and shortness of breath were all monitored before, during, and after exercise. Darrell Ware presented with no problems at today's exercise session.  The patient did have an increase in workload intensity during today's exercise session.  Pre-exercise vitals: . Weight kg: 84.3 . Liters of O2: ra . SpO2: 98 . HR: 68 . BP: 100/44 . CBG: na  Exercise vitals: . Highest heartrate:  108 . Lowest oxygen saturation: 91 . Highest blood pressure: 110/66 . Liters of 02: 4  Post-exercise vitals: . SpO2: 97 . HR: 81 . BP: 124/60 . Liters of O2: ra . CBG: na  [name], Medical Director Dr. Konrad Dolores is immediately available during today's Pulmonary Rehab session for Darrell Ware on 12/05/14 at 10:30am class time

## 2014-12-07 ENCOUNTER — Encounter (HOSPITAL_COMMUNITY)
Admission: RE | Admit: 2014-12-07 | Discharge: 2014-12-07 | Disposition: A | Discharge status: Home / Self Care | Payer: Medicare Other | Source: Ambulatory Visit | Attending: Internal Medicine | Admitting: Internal Medicine

## 2014-12-07 DIAGNOSIS — J8410 Pulmonary fibrosis, unspecified: Secondary | ICD-10-CM | POA: Diagnosis not present

## 2014-12-07 NOTE — Progress Notes (Signed)
Today, Jahmir exercised at [place]. Service time was from 1030 to 1215.[employer]  The patient exercised by performing aerobic, strengthening, and stretching exercises. Oxygen saturation, heart rate, blood pressure, rate of perceived exertion, and shortness of breath were all monitored before, during, and after exercise. Wolfe presented with no problems at today's exercise session. Amauri also attended an education session on risk factor reduction.  The patient did not have an increase in workload intensity during today's exercise session.  Pre-exercise vitals: . Weight kg: 84.1 . Liters of O2: ra . SpO2: 97 . HR: 73 . BP: 100/52 . CBG: na  Exercise vitals: . Highest heartrate:  102 . Lowest oxygen saturation: 91 . Highest blood pressure: 154/72 . Liters of 02: 4L  Post-exercise vitals: . SpO2: 96 . HR: 81 . BP: 114/62 . Liters of O2: ra . CBG: na  Dr. Kalman ShanMurali Ramaswamy, Medical Director Dr. Katrinka BlazingSmith is immediately available during today's Pulmonary Rehab session [facility] on 12/07/2014 at 1030 class time.

## 2014-12-12 ENCOUNTER — Encounter (HOSPITAL_COMMUNITY)
Admission: RE | Admit: 2014-12-12 | Discharge: 2014-12-12 | Disposition: A | Discharge status: Home / Self Care | Payer: Medicare Other | Source: Ambulatory Visit | Attending: Internal Medicine | Admitting: Internal Medicine

## 2014-12-12 DIAGNOSIS — J8410 Pulmonary fibrosis, unspecified: Secondary | ICD-10-CM | POA: Diagnosis not present

## 2014-12-12 NOTE — Progress Notes (Signed)
Today, Darrell Ware exercised at [facility]. Service time was from 10:30am to 12:05pm.  The patient exercised by performing aerobic, strengthening, and stretching exercises. Oxygen saturation, heart rate, blood pressure, rate of perceived exertion, and shortness of breath were all monitored before, during, [name], Medical Director Dr. Katrinka BlazingSmith is immediately available during today's Pulmonary Rehab session [facility] on 12/12/14 at 10:30am class time

## 2014-12-13 ENCOUNTER — Telehealth (HOSPITAL_COMMUNITY): Payer: Self-pay | Admitting: *Deleted

## 2014-12-14 ENCOUNTER — Encounter (HOSPITAL_COMMUNITY): Admission: RE | Admit: 2014-12-14 | Payer: Medicare Other | Source: Ambulatory Visit

## 2014-12-19 ENCOUNTER — Encounter (HOSPITAL_COMMUNITY)
Admission: RE | Admit: 2014-12-19 | Discharge: 2014-12-19 | Disposition: A | Discharge status: Home / Self Care | Payer: Medicare Other | Source: Ambulatory Visit | Attending: Internal Medicine | Admitting: Internal Medicine

## 2014-12-19 DIAGNOSIS — J8410 Pulmonary fibrosis, unspecified: Secondary | ICD-10-CM | POA: Diagnosis not present

## 2014-12-19 NOTE — Progress Notes (Signed)
Today, Keyvon exercised at [facility]. Service time was from 10:30am to 12:00pm.  The patient exercised by performing aerobic, strengthening, and stretching exercises. Oxygen saturation, heart rate, blood pressure, rate of perceived exertion, and shortness of breath were all monitored before, during, and after exercise. [name] presented with no problems at today's exercise session.  The patient did have an increase in workload intensity during today's exercise session.  Pre-exercise vitals: . Weight kg: 85.4 . Liters of O2: ra . SpO2: 98 . HR: 70 . BP: 108/64 . CBG: na  Exercise vitals: . Highest heartrate:  116 . Lowest oxygen saturation: 88 . Highest blood pressure: 152/62 . Liters of 02: 4  Post-exercise vitals: . SpO2: 95 . HR: 87 . BP: 102/64 . Liters of O2: ra . CBG:  na  Dr. Kalman ShanMurali Ramaswamy, Medical Director Dr. Butler Denmarkizwan is immediately available during today's Pulmonary Rehab [facility] on 12/19/14 at 10:30am class time.

## 2014-12-21 ENCOUNTER — Encounter (HOSPITAL_COMMUNITY)
Admission: RE | Admit: 2014-12-21 | Discharge: 2014-12-21 | Disposition: A | Discharge status: Home / Self Care | Payer: Medicare Other | Source: Ambulatory Visit | Attending: Internal Medicine | Admitting: Internal Medicine

## 2014-12-21 DIAGNOSIS — J8410 Pulmonary fibrosis, unspecified: Secondary | ICD-10-CM | POA: Diagnosis not present

## 2014-12-21 NOTE — Progress Notes (Signed)
Today, Darrell Ware exercised at [facility]. Service time was from 1030 to 1210.  The patient exercised by performing aerobic, strengthening, and stretching exercises. Oxygen saturation, heart rate, blood pressure, rate of perceived exertion, and shortness of breath were all monitored before, during, and after exercise. Darrell Ware presented with no problems at today's exercise session. He attended diaphramatic and pursed lip breathing class today.  The patient did not have an increase in workload intensity during today's exercise session.  Pre-exercise vitals: . Weight kg: 84.6 . Liters of O2: RA . SpO2: 97 . HR: 78 . BP: 114/56 . CBG: NA  Exercise vitals: . Highest heartrate:  114 . Lowest oxygen saturation: 87 . Highest blood pressure: 124/54 . Liters of 02: 4  Post-exercise vitals: . SpO2: 95 . HR: 91 . BP: 102/56 . Liters of O2: RA . CBG: NA Dr. Kalman ShanMurali Ramaswamy, Medical Director Dr. Katrinka BlazingSmith is immediately available during today's Pulmonary Rehab session for Darrell Ware on 12/21/2014  at 1030 class time  .

## 2014-12-26 ENCOUNTER — Encounter (HOSPITAL_COMMUNITY)
Admission: RE | Admit: 2014-12-26 | Discharge: 2014-12-26 | Disposition: A | Discharge status: Home / Self Care | Payer: Medicare Other | Source: Ambulatory Visit | Attending: Internal Medicine | Admitting: Internal Medicine

## 2014-12-26 DIAGNOSIS — J8410 Pulmonary fibrosis, unspecified: Secondary | ICD-10-CM | POA: Diagnosis not present

## 2014-12-26 NOTE — Progress Notes (Signed)
Today, Darrell Ware exercised at [facility]. Service time was from 1030 to 1215.  The patient exercised by performing aerobic, strengthening, and stretching exercises. Oxygen saturation, heart rate, blood pressure, rate of perceived exertion, and shortness of breath were all monitored before, during, and after exercise. Darrell Ware presented with no problems at today's exercise session.  The patient did not have an increase in workload intensity during today's exercise session.  Pre-exercise vitals: . Weight kg: 84.0 . Liters of O2: ra . SpO2: 96 . HR: 82 . BP: 110/54 . CBG: na  Exercise vitals: . Highest heartrate:  118 . Lowest oxygen saturation: 91 . Highest blood pressure: 136/64 . Liters of 02: 4  Post-exercise vitals: . SpO2: 96 . HR: 99 . BP: 110/66 . Liters of O2: ra . CBG: na Dr. Kalman ShanMurali Ramaswamy, Medical Director Dr. [name] is immediately available during today's Pulmonary Rehab session for [facility] on 12/26/2014  at 1030 class time  .

## 2014-12-28 ENCOUNTER — Encounter (HOSPITAL_COMMUNITY)
Admission: RE | Admit: 2014-12-28 | Discharge: 2014-12-28 | Disposition: A | Discharge status: Home / Self Care | Payer: Medicare Other | Source: Ambulatory Visit | Attending: Internal Medicine | Admitting: Internal Medicine

## 2014-12-28 DIAGNOSIS — J8410 Pulmonary fibrosis, unspecified: Secondary | ICD-10-CM | POA: Diagnosis not present

## 2014-12-28 NOTE — Progress Notes (Signed)
Today, Darrell Ware exercised at [facility]. Service time was from 1030 to 1230.  The patient exercised by performing aerobic, strengthening, and stretching exercises. Oxygen saturation, heart rate, blood pressure, rate of perceived exertion, and shortness of breath were all monitored before, during, and after exercise. Darrell Ware presented with no problems at today's exercise session. Darrell Ware also attended an education session on [name].  The patient did not have an increase in workload intensity during today's exercise session.  Pre-exercise vitals: . Weight kg: 84.6 . Liters of O2: ra . SpO2: 97 . HR: 82 . BP: 122/70 . CBG: na  Exercise vitals: . Highest heartrate:  113 . Lowest oxygen saturation: 92 . Highest blood pressure: 158/70 . Liters of 02: 4L  Post-exercise vitals: . SpO2: 96 . HR: 91 . BP: 108/60 . Liters of O2: ra . CBG: na  Dr. Kalman ShanMurali Ramaswamy, Medical Director Dr. [name] is immediately available during today's Pulmonary Rehab session for [facility] on 12/28/2014 at 1030 class time

## 2015-01-02 ENCOUNTER — Encounter (HOSPITAL_COMMUNITY)
Admission: RE | Admit: 2015-01-02 | Discharge: 2015-01-02 | Disposition: A | Discharge status: Home / Self Care | Payer: Medicare Other | Source: Ambulatory Visit | Attending: Internal Medicine | Admitting: Internal Medicine

## 2015-01-02 DIAGNOSIS — J8410 Pulmonary fibrosis, unspecified: Secondary | ICD-10-CM | POA: Diagnosis not present

## 2015-01-02 NOTE — Progress Notes (Signed)
Today, Deny exercised at [facility]. Service time was from 10:30am to 12:05pm.  The patient exercised by performing aerobic, strengthening, and stretching exercises. Oxygen saturation, heart rate, blood pressure, rate of perceived exertion, and shortness of breath were all monitored before, during, and after exercise. Seraphim presented with no problems at today's exercise session.  The patient did not have an increase in workload intensity during today's exercise session.  Pre-exercise vitals: . Weight kg: 84.8 . Liters of O2: ra . SpO2: 96 . HR: 73 . BP: 104/44 . CBG: na  Exercise vitals: . Highest heartrate:  125 . Lowest oxygen saturation: 85 . Highest blood pressure: 134/56 . Liters of 02: 4  Post-exercise vitals: . SpO2: 96 . HR: 86 . BP: 110/68 . Liters of O2: ra . CBG: na  Dr. Kalman ShanMurali Ramaswamy, Medical Director Dr. Catha GosselinMikhail is immediately available during today's Pulmonary Rehab session for [facility] on 01/02/15 at 10:30am class time.

## 2015-01-04 ENCOUNTER — Encounter (HOSPITAL_COMMUNITY)
Admission: RE | Admit: 2015-01-04 | Discharge: 2015-01-04 | Disposition: A | Discharge status: Home / Self Care | Payer: Medicare Other | Source: Ambulatory Visit | Attending: Internal Medicine | Admitting: Internal Medicine

## 2015-01-04 DIAGNOSIS — J8410 Pulmonary fibrosis, unspecified: Secondary | ICD-10-CM | POA: Diagnosis not present

## 2015-01-04 NOTE — Progress Notes (Signed)
Today, Clinten exercised at [facility]. Service time was from 1030 to 1200.  The patient exercised by performing aerobic, strengthening, and stretching exercises. Oxygen saturation, heart rate, blood pressure, rate of perceived exertion, and shortness of breath were all monitored before, during, and after exercise. Agustus presented with no problems at today's exercise session.He watched COPD video in class today.  The patient did not have an increase in workload intensity during today's exercise session.  Pre-exercise vitals: . Weight kg: 85.0 . Liters of O2: RA . SpO2: 95 . HR: 80 . BP: 114/60 . CBG: NA  Exercise vitals: . Highest heartrate:  116 . Lowest oxygen saturation: 90 . Highest blood pressure: 132/70 . Liters of 02: 4  Post-exercise vitals: . SpO2: 92 . HR: 93 . BP: 112/60 . Liters of O2: RA . CBG: NA Dr. Kalman ShanMurali Ramaswamy, Medical Director Dr. [name] is immediately available during today's Pulmonary Rehab session for [facility] on 01/04/2015  at 1030 class time  .

## 2015-01-09 ENCOUNTER — Encounter (HOSPITAL_COMMUNITY)
Admission: RE | Admit: 2015-01-09 | Discharge: 2015-01-09 | Disposition: A | Discharge status: Home / Self Care | Payer: Medicare Other | Source: Ambulatory Visit | Attending: Internal Medicine | Admitting: Internal Medicine

## 2015-01-09 DIAGNOSIS — J8410 Pulmonary fibrosis, unspecified: Secondary | ICD-10-CM | POA: Diagnosis not present

## 2015-01-09 NOTE — Progress Notes (Signed)
Today, Leng exercised at [facility]. Service time was from 1030 to 1200.  The patient exercised by performing aerobic, strengthening, and stretching exercises. Oxygen saturation, heart rate, blood pressure, rate of perceived exertion, and shortness of breath were all monitored before, during, and after exercise. Darrell Ware presented with no problems at today's exercise session.  The patient did not have an increase in workload intensity during today's exercise session.  Pre-exercise vitals: . Weight kg: 84.6 . Liters of O2: RA . SpO2: 95 . HR: 72 . BP: 104/62 . CBG: NA  Exercise vitals: . Highest heartrate:  123 . Lowest oxygen saturation: 87 increased to 95 . Highest blood pressure: 148/60 . Liters of 02: 4  Post-exercise vitals: . SpO2: 95 . HR: 87 . BP: 120/60 . Liters of O2: RA . CBG: NA Dr. Kalman ShanMurali Ramaswamy, Medical Director Dr. [name] is immediately available during today's Pulmonary Rehab session for [facility] on 01/09/2015  at 1030 class time  .

## 2015-01-11 ENCOUNTER — Encounter (HOSPITAL_COMMUNITY)
Admission: RE | Admit: 2015-01-11 | Discharge: 2015-01-11 | Disposition: A | Discharge status: Home / Self Care | Payer: Medicare Other | Source: Ambulatory Visit | Attending: Internal Medicine | Admitting: Internal Medicine

## 2015-01-11 DIAGNOSIS — J8410 Pulmonary fibrosis, unspecified: Secondary | ICD-10-CM | POA: Diagnosis not present

## 2015-01-11 NOTE — Progress Notes (Signed)
Today, Kielan exercised at [facility]. Service time was from 10:30am to 12:15pm.  The patient exercised by performing aerobic, strengthening, and stretching exercises. Oxygen saturation, heart rate, blood pressure, rate of perceived exertion, and shortness of breath were all monitored before, during, and after exercise. Eriel presented with no problems at today's exercise session.  The patient attended education class today with [facility] Jaaliyah Lucatero on Exercise for the Pulmonary Patient.  The patient did not have an increase in workload intensity during today's exercise session.  Pre-exercise vitals: . Weight kg: 84.2 . Liters of O2: ra . SpO2: 97 . HR: 76 . BP: 102/54 . CBG: na  Exercise vitals: . Highest heartrate:  122 . Lowest oxygen saturation: 89 . Highest blood pressure: 130/64 . Liters of 02: 4  Post-exercise vitals: . SpO2: 95 . HR: 89 . BP: 116/64 . Liters of O2: ra . CBG: na  Dr. Kalman ShanMurali Ramaswamy, Medical Director Dr. Elvera LennoxGherghe is immediately available during today's Pulmonary Rehab session for [facility] on 01/11/15 at 10:30am class time.

## 2015-01-16 ENCOUNTER — Encounter (HOSPITAL_COMMUNITY): Payer: Medicare Other

## 2015-01-18 ENCOUNTER — Encounter (HOSPITAL_COMMUNITY): Payer: Medicare Other

## 2015-01-23 ENCOUNTER — Encounter (HOSPITAL_COMMUNITY): Payer: Medicare Other

## 2015-01-25 ENCOUNTER — Encounter (HOSPITAL_COMMUNITY)
Admission: RE | Admit: 2015-01-25 | Discharge: 2015-01-25 | Disposition: A | Discharge status: Home / Self Care | Payer: Medicare Other | Source: Ambulatory Visit | Attending: Internal Medicine | Admitting: Internal Medicine

## 2015-01-25 DIAGNOSIS — J8410 Pulmonary fibrosis, unspecified: Secondary | ICD-10-CM | POA: Diagnosis not present

## 2015-01-25 NOTE — Progress Notes (Signed)
Today, [name] exercised at [facility]. Service time was from 10:30am to 12:10pm.  The patient exercised by performing aerobic, strengthening, and stretching exercises. Oxygen saturation, heart rate, blood pressure, rate of perceived exertion, and shortness of breath were all monitored before, during, and after exercise. Darrell Ware presented with no problems at today's exercise session. The patient attended education class today with Haskel KhanJoan Behrens on Signs and Symptoms.  The patient did not have an increase in workload intensity during today's exercise session.  Pre-exercise vitals: . Weight kg: 85.7 . Liters of O2: ra . SpO2: 95 . HR: 73 . BP: 104/50 . CBG: na  Exercise vitals: . Highest heartrate:  113 . Lowest oxygen saturation: 87 . Highest blood pressure: 160/72 . Liters of 02: 4  Post-exercise vitals: . SpO2: 96 . HR: 83 . BP: 126/62 . Liters of O2: ra . CBG: na  Dr. Alyson ReedyWesam G. Yacoub, Medical Director Dr. Benjamine MolaVann is immediately available during today's Pulmonary Rehab session for [facility] on 01/25/15 at 10:30am class time

## 2015-01-30 ENCOUNTER — Encounter (HOSPITAL_COMMUNITY)
Admission: RE | Admit: 2015-01-30 | Discharge: 2015-01-30 | Disposition: A | Discharge status: Home / Self Care | Payer: Medicare Other | Source: Ambulatory Visit | Attending: Internal Medicine | Admitting: Internal Medicine

## 2015-01-30 DIAGNOSIS — J8410 Pulmonary fibrosis, unspecified: Secondary | ICD-10-CM | POA: Diagnosis not present

## 2015-01-30 NOTE — Progress Notes (Signed)
Today, Darrell Ware exercised at [facility]. Service time was from 1030 to 1210.  The patient exercised by performing aerobic, strengthening, and stretching exercises. Oxygen saturation, heart rate, blood pressure, rate of perceived exertion, and shortness of breath were all monitored before, during, and after exercise. [name] presented with no problems at today's exercise session.  The patient did not have an increase in workload intensity during today's exercise session.  Pre-exercise vitals: . Weight kg: 85.2 . Liters of O2: ra . SpO2: 98 . HR: 71 . BP: 160/64 . CBG: na  Exercise vitals: . Highest heartrate:  117 . Lowest oxygen saturation: 90 . Highest blood pressure: 136/70 . Liters of 02: 4L  Post-exercise vitals: . SpO2: 96 . HR: 93 . BP: 100/56 . Liters of O2: ra . CBG: na  Dr. Alyson ReedyWesam G. Yacoub, Medical Director Dr. [name] is immediately available during today's Pulmonary Rehab session for [facility] on 01/30/2015 at 1030 class time

## 2015-02-01 ENCOUNTER — Encounter (HOSPITAL_COMMUNITY)
Admission: RE | Admit: 2015-02-01 | Discharge: 2015-02-01 | Disposition: A | Discharge status: Home / Self Care | Payer: Medicare Other | Source: Ambulatory Visit | Attending: Internal Medicine | Admitting: Internal Medicine

## 2015-02-01 ENCOUNTER — Telehealth: Payer: Self-pay | Admitting: Internal Medicine

## 2015-02-01 DIAGNOSIS — J8410 Pulmonary fibrosis, unspecified: Secondary | ICD-10-CM | POA: Diagnosis not present

## 2015-02-01 DIAGNOSIS — J84112 Idiopathic pulmonary fibrosis: Secondary | ICD-10-CM

## 2015-02-01 NOTE — Progress Notes (Signed)
Today, [name] exercised at [facility]. Service time was from 10:30am to 12:15pm.  The patient exercised by performing aerobic, strengthening, and stretching exercises. Oxygen saturation, heart rate, blood pressure, rate of perceived exertion, and shortness of breath were all monitored before, during, and after exercise. Darrell Ware presented with no problems at today's exercise session. The patient attended education today with Yvone NeuPortia Payne on Anatomy and Physiology.  The patient did not have an increase in workload intensity during today's exercise session.  Pre-exercise vitals: . Weight kg: 84.6 . Liters of O2: ra . SpO2: 96 . HR: 77 . BP: 120/52 . CBG: na  Exercise vitals: . Highest heartrate:  116 . Lowest oxygen saturation: 85 . Highest blood pressure: 136/56 . Liters of 02: 4  Post-exercise vitals: . SpO2: 96 . HR: 93 . BP: 118/72 . Liters of O2: ra . CBG: na  Dr. Alyson ReedyWesam G. Yacoub, Medical Director Dr. Carmell Austriaegaldo is immediately available during today's Pulmonary Rehab session for [facility] on 02/01/15 at 10:30am class time.

## 2015-02-01 NOTE — Telephone Encounter (Signed)
Patient Instructions       ICD-9-CM ICD-10-CM   1. Idiopathic pulmonary fibrosis 516.31 J84.112     Clinically stable Do KBILD question Continue ofev at lower dose of 100mg  twice daily Do LFT after thanksigivng 2016  Followup REturn to see me early-mid Jan 2017 ahead of [place] trip 03/17/15    Spoke with pt's wife. Order has been placed for Hepatic Panel. Nothing further was needed at this time.

## 2015-02-06 ENCOUNTER — Encounter (HOSPITAL_COMMUNITY)
Admission: RE | Admit: 2015-02-06 | Discharge: 2015-02-06 | Disposition: A | Discharge status: Home / Self Care | Payer: Medicare Other | Source: Ambulatory Visit | Attending: Internal Medicine | Admitting: Internal Medicine

## 2015-02-06 DIAGNOSIS — J8410 Pulmonary fibrosis, unspecified: Secondary | ICD-10-CM | POA: Diagnosis not present

## 2015-02-06 NOTE — Progress Notes (Signed)
Today, Mohamadou exercised at [facility]. Service time was from 10:30am to 12:00pm.  The patient exercised by performing aerobic, strengthening, and stretching exercises. Oxygen saturation, heart rate, blood pressure, rate of perceived exertion, and shortness of breath were all monitored before, during, and after exercise. Shaquill presented with no problems at today's exercise session.  The patient did have an increase in workload intensity during today's exercise session.  Pre-exercise vitals: . Weight kg: 85.5 . Liters of O2: ra . SpO2: 97 . HR: 69 . BP: 128/60 . CBG: na  Exercise vitals: . Highest heartrate:  113 . Lowest oxygen saturation: 86 . Highest blood pressure: 140/56 . Liters of 02: 4  Post-exercise vitals: . SpO2: 96 . HR: 84 . BP: 118/62 . Liters of O2: ra . CBG: na  Dr. Alyson ReedyWesam G. Yacoub, Medical Director Dr. Carmell Austriaegaldo is immediately available during today's Pulmonary Rehab session for [facility] on 02/06/15 at 10:30am class time.

## 2015-02-08 ENCOUNTER — Encounter (HOSPITAL_COMMUNITY)
Admission: RE | Admit: 2015-02-08 | Discharge: 2015-02-08 | Disposition: A | Discharge status: Home / Self Care | Payer: Medicare Other | Source: Ambulatory Visit | Attending: Internal Medicine | Admitting: Internal Medicine

## 2015-02-08 DIAGNOSIS — J8410 Pulmonary fibrosis, unspecified: Secondary | ICD-10-CM | POA: Diagnosis not present

## 2015-02-08 NOTE — Progress Notes (Signed)
Today, [name] exercised at [facility]. Service time was from 1030 to 1215.  The patient exercised by performing aerobic, strengthening, and stretching exercises. Oxygen saturation, heart rate, blood pressure, rate of perceived exertion, and shortness of breath were all monitored before, during, and after exercise. [name] presented with no problems at today's exercise session. He attended Advanced Directive class today.  The patient did  have an increase in workload intensity during today's exercise session.  Pre-exercise vitals: . Weight kg: 84.5 . Liters of O2: RA . SpO2: 91 . HR: 78 . BP: 120/60 . CBG: NA  Exercise vitals: . Highest heartrate:  108 . Lowest oxygen saturation: 91 . Highest blood pressure: 124/64 . Liters of 02: 4  Post-exercise vitals: . SpO2: 95 . HR: 97 . BP: 128/60 . Liters of O2: RA . CBG: NA [name], Medical Director Dr. Carmell Ware is immediately available during today's Pulmonary Rehab session for Darrell Ware on 02/08/2015  at 1030 class time.

## 2015-02-13 ENCOUNTER — Encounter (HOSPITAL_COMMUNITY)
Admission: RE | Admit: 2015-02-13 | Discharge: 2015-02-13 | Disposition: A | Discharge status: Home / Self Care | Payer: Medicare Other | Source: Ambulatory Visit | Attending: Internal Medicine | Admitting: Internal Medicine

## 2015-02-13 ENCOUNTER — Other Ambulatory Visit (INDEPENDENT_AMBULATORY_CARE_PROVIDER_SITE_OTHER): Payer: Medicare Other

## 2015-02-13 DIAGNOSIS — J84112 Idiopathic pulmonary fibrosis: Secondary | ICD-10-CM

## 2015-02-13 DIAGNOSIS — J8410 Pulmonary fibrosis, unspecified: Secondary | ICD-10-CM | POA: Diagnosis not present

## 2015-02-13 LAB — HEPATIC FUNCTION PANEL
ALBUMIN: 4.1 g/dL (ref 3.5–5.2)
ALK PHOS: 46 U/L (ref 39–117)
ALT: 20 U/L (ref 0–53)
AST: 16 U/L (ref 0–37)
BILIRUBIN DIRECT: 0.1 mg/dL (ref 0.0–0.3)
TOTAL PROTEIN: 7.6 g/dL (ref 6.0–8.3)
Total Bilirubin: 0.4 mg/dL (ref 0.2–1.2)

## 2015-02-13 NOTE — Progress Notes (Signed)
Django completed a Six-Minute Walk Test on 02/13/15 . Khaled walked 1356 feet with 0 breaks.  The patient's lowest oxygen saturation was 89 %, highest heart rate was 78 bpm , and highest blood pressure was 128/68. The patient was on 4 liters of oxygen with a nasal cannula. Patient stated that nothing hindered their walk test.

## 2015-02-14 ENCOUNTER — Encounter: Payer: Self-pay | Admitting: Internal Medicine

## 2015-02-14 ENCOUNTER — Ambulatory Visit (INDEPENDENT_AMBULATORY_CARE_PROVIDER_SITE_OTHER): Payer: Medicare Other | Admitting: Internal Medicine

## 2015-02-14 VITALS — BP 136/68 | HR 74 | Ht 75.0 in | Wt 185.0 lb

## 2015-02-14 DIAGNOSIS — J84112 Idiopathic pulmonary fibrosis: Secondary | ICD-10-CM

## 2015-02-14 NOTE — Progress Notes (Signed)
Subjective:    Patient ID: Darrell Ware, male    DOB: 1926-08-25, 81 y.o.   MRN: [id number]  HPI   OV 09/01/2014  Chief Complaint  Patient presents with  . Follow-up    Pt is a former [facility] pt. Pt states he was on OFEV but stoppped March 2016. Pt stated he rapidly declined since not being on the OFEV. When pt saw [facility] pt requested to be placed back on OFEV. Pt is currently on 150mg  BID. Pt c/o DOE, intermittent cough.    Follow-up idiopathic pulmonary fibrosis (diagnosis based on age, male sex, negative autoimmune profile and a CT scan of the chest April 2013]  This is a transfer of care. Patient is to be followed by Dr. Marcelyn Bruins and once by Dr. Clayton Lefort for IPF. He is now following with me for Specialist IPF care. Presents with his wife both give history.  IPF diagnosed in spring of 2013. Then in the summer of 2014 through the expanded access program was on Pirfenidone but developed extremely high liver function anormlaity and had to be discontinued. Subsequently in the summer of 2015 was started on Nintedanib (Ofev) . He tolerated this well for 6 months. Then in the early part of 2016 started developing diarrhea with associated anorexia and weight loss. Symptoms are progressive. Symptoms ultimately became severe o he had abruptly discontinue the drug. Subsequently within a few days he started feeling dramatically better and all side effects resolved. However since that time he is having progressive dyspnea. He feels his interstitial lung disease is worse. A year ago overnight oxygen study and exertional pulse ox while he exercises on a recumbent bike at home showed no desaturation. However now for the last few months he is noticing desaturation into the 85% when he exercises. Correspondingly 2 days ago at our pulmonary office when he exerted he did desaturate to below 88%. He knows wants to restart his Ninetedanib ; wife wants to start him at low dose. He is currently taking old supply at the  full dose at 150 mg twice daily. He is open to attending pulmonary rehabilitation because his home exercise program has trailed off given his worsening symptoms. He is active in the pulmonary fibrosis foundation support group  His pulmonary function test within July 2015 when he was started on Ninntedanib and through March 2016 when he came off the drug have remained stable with moderate disease severity only. His FVC is 3.76 L/87%, total lung capacity is 5.12 L/66% and DLCO is 13.7/37%.    09/29/2014 Follow up : IPF and Chronic Hypoxic Resp Failure  Patient returns for a 4 week follow-up Patient was seen last visit with change of his pulmonary fibrosis medicine, OFEV , to lower dose to help with potential GI side effects. He is currently on 100 mg twice daily. He says he is tolerating well. Does have some mild nausea, but is tolerable. We discussed getting lab work today with LFTs. Patient was also started on oxygen with activity. He says this is really helped with his exercise regimen. He is waiting on his portable concentrator to be delivered. Order was checked on through his DME company today and has been approved. He has been referred to pulmonary rehabilitation, he is currently waiting for call for initiation of program. PFTs were repeated today that are similar to 2013. FVC 88%, FEV100%, ratio 80, diffusing capacity 38%. Patient says overall he feels that he is improved. He does get fatigued but feels  that this is slightly improved. He tries to exercise daily. He travels quite a bit with his wife. Is planning a trip to [place] in February.    OV 11/24/2014   Chief Complaint  Patient presents with  . Follow-up    pt. states breathing is baseline. SOB at baseline. dry cough is baseline. denies chest pain/tightness.   Follow-up idiopathic pulmonary fibrosis - on low dose ofev due to gi side effects   He presents with his wife. He is tolerating the low-dose of Ofev quite well. Does not  have any GI side effects. Overall he and his wife attest that his pulmonary health is stable. Looking at the interstitial lung disease questionnaire below it is obvious that he has significant dyspnea when he climbs stairs or hill but otherwise is a high performing individual. He is planning a trip in [place] in January this is an annual winter trip where he goes for 2 weeks. Otherwise no new problems. He was attending the pulmonary fibrosis foundation support group yesterday. He uses oxygen with exertion   Last the function test August 2016 was normal this is for monitoring on Ofev which is needed every 3 months  OV 02/14/2015  Chief Complaint  Patient presents with  . Follow-up    Pt states his breathing is unchanged since last OV. Pt states he feels a slight decline in breathing since one year ago - DOE. Pt c/o dry cough and intermittent chest discomfort.    Follow-up idiopathic pulmonary fibrosis-on nintedanib since summer 2015 and on low-dose since July 2006 seen due to GI side effects [hepatotoxicity with Esbriet 2014]  He has completed pulmonary rehabilitation. He says he learned a lot. His 6 minute walk distance improved with rehabilitation. He feels stable. However the rehabilitation specialist several options oxygen with exertion to 4 L. He does not think this is due to her decline. He thinks this is more due to his baseline requirement. Liver function test yesterday was normal. Spirometry today is FVC 3.5 L/72% and it is a decline since August 2016 PFT but as is often the case in our practice the PFT and spirometry is do not fully correlate. So I will taken at face value that he is stable. He is due to go to [place] in January 2017 and he wanted me to fill out paperwork. He wants to use the oxygen in flight and during landing.  K-BILD ILD QUESTIONNAIRE, Symptom score over prior 2 weeks  7-none, 6-rarely, 5-occ, 5-some times, 3-sev times, 2-most times, 1-every time 11/24/2014   Dyspnea for  stairs, incline or hill 2  Chest Tightness 6  Worry about seriousness of lung complaint 5  Avoided doing things that make you dyspneic 5  Have you felt loss of control of lung condition (reversed from original) 6  Felt fed up due to lung condition 5  Felt urge to breathe aka air hunger 6  Has lung condition made you feel anxious 6  How often have you experienced wheezing or whistling sound 6  How much of the time have you felt your lung dz is getting worse 7  How much has your lung condition interfered with job or daily task 5  Were you expecting your lung condition to get worse 7  How much has your lung function limited you carrying things like groceris 6  How much has your lung function made you think of EOL? 6  Total 78  Are you financially worse off 6  Grand Total 84  Current outpatient prescriptions:  .  AZOPT 1 % ophthalmic suspension, Place 1 drop into the right eye 3 (three) times daily. , Disp: , Rfl:  .  Beta Sitosterol 40 % POWD, 1 tablet by Does not apply route 2 (two) times daily., Disp: , Rfl:  .  Cyanocobalamin (B-12) 2500 MCG SUBL, Place under the tongue daily., Disp: , Rfl:  .  Flaxseed, Linseed, (FLAX SEED OIL PO), Take by mouth daily., Disp: , Rfl:  .  LOTEMAX 0.5 % GEL, Place 1 drop into the right eye 2 (two) times daily. , Disp: , Rfl:  .  Multiple Vitamin (MULTIVITAMIN) capsule, Take 1 capsule by mouth daily., Disp: , Rfl:  .  Nintedanib (OFEV) 100 MG CAPS, Take 100 mg by mouth 2 (two) times daily., Disp: 60 capsule, Rfl: 6 .  NON FORMULARY, Shaklee supplement twice daily, Disp: , Rfl:  .  ZIOPTAN 0.0015 % SOLN, Place 1 drop into the right eye daily., Disp: , Rfl:   Allergies  Allergen Reactions  . Peanut-Containing Drug Products     Cough,wheezing - GENERAL PEANUT ALLERGY  . Pirfenidone     Liver trouble  . Cialis [Tadalafil] Rash    Immunization History  Administered Date(s) Administered  . Influenza Split 11/25/2011, 01/02/2014  .  Influenza,inj,Quad PF,36+ Mos 11/24/2012, 11/25/2014  . Pneumococcal-Unspecified 04/24/2012     Review of Systems  Constitutional: Negative for fever and unexpected weight change.  HENT: Negative for congestion, dental problem, ear pain, nosebleeds, postnasal drip, rhinorrhea, sinus pressure, sneezing, sore throat and trouble swallowing.   Eyes: Negative for redness and itching.  Respiratory: Positive for cough and shortness of breath. Negative for chest tightness and wheezing.   Cardiovascular: Negative for palpitations and leg swelling.  Gastrointestinal: Negative for nausea and vomiting.  Genitourinary: Negative for dysuria.  Musculoskeletal: Negative for joint swelling.  Skin: Negative for rash.  Neurological: Negative for headaches.  Hematological: Does not bruise/bleed easily.  Psychiatric/Behavioral: Negative for dysphoric mood. The patient is not nervous/anxious.        Objective:   Physical Exam  Constitutional: He is oriented to person, place, and time. He appears well-developed and well-nourished. No distress.  HENT:  Head: Normocephalic and atraumatic.  Right Ear: External ear normal.  Left Ear: External ear normal.  Mouth/Throat: Oropharynx is clear and moist. No oropharyngeal exudate.  Eyes: Conjunctivae and EOM are normal. Pupils are equal, round, and reactive to light. Right eye exhibits no discharge. Left eye exhibits no discharge. No scleral icterus.  Neck: Normal range of motion. Neck supple. No JVD present. No tracheal deviation present. No thyromegaly present.  Cardiovascular: Normal rate, regular rhythm and intact distal pulses.  Exam reveals no gallop and no friction rub.   No murmur heard. Pulmonary/Chest: Effort normal. No respiratory distress. He has no wheezes. He has rales. He exhibits no tenderness.  Abdominal: Soft. Bowel sounds are normal. He exhibits no distension and no mass. There is no tenderness. There is no rebound and no guarding.    Musculoskeletal: Normal range of motion. He exhibits no edema or tenderness.  Lymphadenopathy:    He has no cervical adenopathy.  Neurological: He is alert and oriented to person, place, and time. He has normal reflexes. No cranial nerve deficit. Coordination normal.  Skin: Skin is warm and dry. No rash noted. He is not diaphoretic. No erythema. No pallor.  Psychiatric: He has a normal mood and affect. His behavior is normal. Judgment and thought content normal.  Nursing note and  vitals reviewed.    Filed Vitals:   02/14/15 1652  BP: 136/68  Pulse: 74  Height: 6\' 3"  (1.905 m)  Weight: 185 lb (83.915 kg)  SpO2: 97%      Recent Labs Lab 02/13/15 1211  AST 16  ALT 20  ALKPHOS 46  BILITOT 0.4  PROT 7.6  ALBUMIN 4.1       Assessment & Plan:     ICD-9-CM ICD-10-CM   1. Idiopathic pulmonary fibrosis (HCC) 516.31 J84.112 Spirometry with Graph     Pulmonary Function Test     IPF appears stable  PLAN Ofev low dose 100mg  twice daily O2 with exertion Glad you finished rehab O2 for inflight to [place] -form filled PFT in 3 months  Followup - Full PFT and LFT in 3 months - fu in 3 months    Dr. Kalman ShanMurali Woodward Klem, M.D., [facility] Pulmonary and Critical Care Medicine Staff Physician [facility] Pager: [phone], If no answer or between  15:00h - 7:00h: call [phone]  02/14/2015 6:04 PM

## 2015-02-14 NOTE — Patient Instructions (Addendum)
ICD-9-CM ICD-10-CM   1. Idiopathic pulmonary fibrosis (HCC) 516.31 J84.112 Spirometry with Graph    IPF appears stable  PLAN Ofev low dose 100mg  twice daily O2 with exertion Glad you finished rehab O2 for inflight to [place] -form filled PFT in 3 months  Followup - Full PFT and LFT in 3 months - fu in 3 months

## 2015-02-15 ENCOUNTER — Encounter (HOSPITAL_COMMUNITY): Payer: Medicare Other

## 2015-02-20 ENCOUNTER — Encounter (HOSPITAL_COMMUNITY): Payer: Medicare Other

## 2015-02-20 NOTE — Progress Notes (Signed)
Pulmonary Rehab Discharge Note: Darrell Ware has been discharged from pulmonary rehab after successfully completing 21 exercise and education sessions. Darrell Ware requested early graduation related to his travel over the holidays. Darrell Ware increased his stamina and strength while in the program as evidenced by his ability to walk an additional 127 feet on his discharge walk test as compared to his admission. Darrell Ware met both of his personal goals. Darrell Ware stated that Darrell Ware now knows the safety limitis and parameters for exercise with pulmonary fibrosis including oxygen saturation goals of 88% or greater. Darrell Ware understands how to purse lip breath and when Darrell Ware should take a rest break. His discharge PHQ2 score decreased from 1 to 0. Darrell Ware plans to continue to exercise post discharge at the local [employer].

## 2015-02-22 ENCOUNTER — Encounter (HOSPITAL_COMMUNITY): Payer: Medicare Other

## 2015-02-27 ENCOUNTER — Encounter (HOSPITAL_COMMUNITY): Payer: Medicare Other

## 2015-07-24 ENCOUNTER — Ambulatory Visit: Payer: Medicare Other | Admitting: Internal Medicine

## 2015-09-12 ENCOUNTER — Ambulatory Visit (INDEPENDENT_AMBULATORY_CARE_PROVIDER_SITE_OTHER): Payer: Medicare Other | Admitting: Internal Medicine

## 2015-09-12 ENCOUNTER — Other Ambulatory Visit (INDEPENDENT_AMBULATORY_CARE_PROVIDER_SITE_OTHER): Payer: Medicare Other

## 2015-09-12 ENCOUNTER — Encounter: Payer: Self-pay | Admitting: Internal Medicine

## 2015-09-12 VITALS — BP 128/64 | HR 77 | Ht 75.0 in | Wt 188.0 lb

## 2015-09-12 DIAGNOSIS — Z5181 Encounter for therapeutic drug level monitoring: Secondary | ICD-10-CM | POA: Diagnosis not present

## 2015-09-12 DIAGNOSIS — J84112 Idiopathic pulmonary fibrosis: Secondary | ICD-10-CM | POA: Diagnosis not present

## 2015-09-12 LAB — PULMONARY FUNCTION TEST
DL/VA % pred: 54 %
DL/VA: 2.58 ml/min/mmHg/L
DLCO COR % PRED: 35 %
DLCO COR: 12.91 ml/min/mmHg
DLCO unc % pred: 36 %
DLCO unc: 13.39 ml/min/mmHg
FEF 25-75 POST: 3.3 L/s
FEF 25-75 Pre: 2.41 L/sec
FEF2575-%CHANGE-POST: 36 %
FEF2575-%PRED-POST: 171 %
FEF2575-%PRED-PRE: 125 %
FEV1-%Change-Post: 5 %
FEV1-%Pred-Post: 98 %
FEV1-%Pred-Pre: 92 %
FEV1-POST: 2.91 L
FEV1-Pre: 2.75 L
FEV1FVC-%CHANGE-POST: 3 %
FEV1FVC-%PRED-PRE: 112 %
FEV6-%Change-Post: 3 %
FEV6-%Pred-Post: 90 %
FEV6-%Pred-Pre: 87 %
FEV6-POST: 3.58 L
FEV6-Pre: 3.47 L
FEV6FVC-%Change-Post: 0 %
FEV6FVC-%Pred-Post: 107 %
FEV6FVC-%Pred-Pre: 106 %
FVC-%CHANGE-POST: 2 %
FVC-%PRED-POST: 84 %
FVC-%PRED-PRE: 82 %
FVC-POST: 3.58 L
FVC-Pre: 3.49 L
POST FEV1/FVC RATIO: 81 %
PRE FEV1/FVC RATIO: 79 %
PRE FEV6/FVC RATIO: 99 %
Post FEV6/FVC ratio: 100 %
RV % pred: 58 %
RV: 1.74 L
TLC % PRED: 68 %
TLC: 5.24 L

## 2015-09-12 LAB — HEPATIC FUNCTION PANEL
ALBUMIN: 4.2 g/dL (ref 3.5–5.2)
ALT: 22 U/L (ref 0–53)
AST: 18 U/L (ref 0–37)
Alkaline Phosphatase: 50 U/L (ref 39–117)
BILIRUBIN DIRECT: 0.1 mg/dL (ref 0.0–0.3)
TOTAL PROTEIN: 7.9 g/dL (ref 6.0–8.3)
Total Bilirubin: 0.4 mg/dL (ref 0.2–1.2)

## 2015-09-12 NOTE — Patient Instructions (Addendum)
ICD-9-CM ICD-10-CM   1. Idiopathic pulmonary fibrosis (HCC) 516.31 J84.112   2. Encounter for therapeutic drug monitoring V58.83 Z51.81    Mild progression 8% of IPF in 2 years, 5.7% in 1 year, 0% in 6 months  Plan  - continue low dose ofev 100mg  twice daily  - do LFT check  Fu 6 months or sooner if needed  - Spirometry in office and walk at followup

## 2015-09-12 NOTE — Progress Notes (Signed)
PFT done today. 

## 2015-09-12 NOTE — Progress Notes (Signed)
Subjective:     Patient ID: Darrell Ware, male   DOB: Feb 06, 1927, 82 y.o.   MRN: [id number]  HPI    OV 09/01/2014  Chief Complaint  Patient presents with  . Follow-up    Pt is a former [facility] pt. Pt states he was on OFEV but stoppped March 2016. Pt stated he rapidly declined since not being on the OFEV. When pt saw [facility] pt requested to be placed back on OFEV. Pt is currently on  BID. Pt c/o DOE, intermittent cough.    Follow-up idiopathic pulmonary fibrosis (diagnosis based on age, male sex, negative autoimmune profile and a CT scan of the chest April 2013]  This is a transfer of care. Patient is to be followed by Dr. Marcelyn Bruins and once by Dr. Clayton Lefort for IPF. He is now following with me for Specialist IPF care. Presents with his wife both give history.  IPF diagnosed in spring of 2013. Then in the summer of 2014 through the expanded access program was on Pirfenidone but developed extremely high liver function anormlaity and had to be discontinued. Subsequently in the summer of 2015 was started on Nintedanib (Ofev) . He tolerated this well for 6 months. Then in the early part of 2016 started developing diarrhea with associated anorexia and weight loss. Symptoms are progressive. Symptoms ultimately became severe o he had abruptly discontinue the drug. Subsequently within a few days he started feeling dramatically better and all side effects resolved. However since that time he is having progressive dyspnea. He feels his interstitial lung disease is worse. A year ago overnight oxygen study and exertional pulse ox while he exercises on a recumbent bike at home showed no desaturation. However now for the last few months he is noticing desaturation into the 85% when he exercises. Correspondingly 2 days ago at our pulmonary office when he exerted he did desaturate to below 88%. He knows wants to restart his Ninetedanib ; wife wants to start him at low dose. He is currently taking old supply at the full  dose at 150 mg twice daily. He is open to attending pulmonary rehabilitation because his home exercise program has trailed off given his worsening symptoms. He is active in the pulmonary fibrosis foundation support group  His pulmonary function test within July 2015 when he was started on Ninntedanib and through March 2016 when he came off the drug have remained stable with moderate disease severity only. His FVC is 3.76 L/87%, total lung capacity is 5.12 L/66% and DLCO is 13.7/37%.    09/29/2014 Follow up : IPF and Chronic Hypoxic Resp Failure  Patient returns for a 4 week follow-up Patient was seen last visit with change of his pulmonary fibrosis medicine, OFEV , to lower dose to help with potential GI side effects. He is currently on 100 mg twice daily. He says he is tolerating well. Does have some mild nausea, but is tolerable. We discussed getting lab work today with LFTs. Patient was also started on oxygen with activity. He says this is really helped with his exercise regimen. He is waiting on his portable concentrator to be delivered. Order was checked on through his DME company today and has been approved. He has been referred to pulmonary rehabilitation, he is currently waiting for call for initiation of program. PFTs were repeated today that are similar to 2013. FVC 88%, FEV100%, ratio 80, diffusing capacity 38%. Patient says overall he feels that he is improved. He does get fatigued but feels  that this is slightly improved. He tries to exercise daily. He travels quite a bit with his wife. Is planning a trip to [place] in February.    OV 11/24/2014   Chief Complaint  Patient presents with  . Follow-up    pt. states breathing is baseline. SOB at baseline. dry cough is baseline. denies chest pain/tightness.   Follow-up idiopathic pulmonary fibrosis - on low dose ofev due to gi side effects   He presents with his wife. He is tolerating the low-dose of Ofev quite well. Does not have  any GI side effects. Overall he and his wife attest that his pulmonary health is stable. Looking at the interstitial lung disease questionnaire below it is obvious that he has significant dyspnea when he climbs stairs or hill but otherwise is a high performing individual. He is planning a trip in [place] in January this is an annual winter trip where he goes for 2 weeks. Otherwise no new problems. He was attending the pulmonary fibrosis foundation support group yesterday. He uses oxygen with exertion   Last the function test August 2016 was normal this is for monitoring on Ofev which is needed every 3 months  OV 02/14/2015  Chief Complaint  Patient presents with  . Follow-up    Pt states his breathing is unchanged since last OV. Pt states he feels a slight decline in breathing since one year ago - DOE. Pt c/o dry cough and intermittent chest discomfort.    Follow-up idiopathic pulmonary fibrosis-on nintedanib since summer 2015 and on low-dose since July 2016 seen due to GI side effects [hepatotoxicity with Esbriet 2014]  He has completed pulmonary rehabilitation. He says he learned a lot. His 6 minute walk distance improved with rehabilitation. He feels stable. However the rehabilitation specialist several options oxygen with exertion to 4 L. He does not think this is due to her decline. He thinks this is more due to his baseline requirement. Liver function test yesterday was normal. Spirometry today is FVC 3.5 L/72% and it is a decline since August 2016 PFT but as is often the case in our practice the PFT and spirometry is do not fully correlate. So I will taken at face value that he is stable. He is due to go to [place] in January 2017 and he wanted me to fill out paperwork. He wants to use the oxygen in flight and during landing.   OV 09/12/2015  Chief Complaint  Patient presents with  . Follow-up    review pft.  pt states his breathing is unchanged- SOB with exertion.    Follow-up idiopathic  pulmonary fibrosis: is-on nintedanib since summer 2015 and on low-dose since July 2016 seen due to GI side effects [hepatotoxicity with Esbriet 2014]  Last seen December 2016. After that he went 5 weeks to [place]. He and his wife enjoyed themselves a lot. This is an annual condition for them. Overall he is reporting stability in the last 6 months. However compared to a year or 2 ago he feels the dz  slowly gradually progressive. In fact on forced vital capacity he's had 8% progression in that in 2 years and 5.7% progression in 1 year with 0% progression in 6 months. His FVC and July 2015 was 3.89 he did in August 2016 it was 3.8. In December 2016 was 3.5 and currently it is 3.58. There is also some progression and DLCO over time. He is fully active. Last liver function test was December 2016.   K-BILD ILD  QUESTIONNAIRE, Symptom score over prior 2 weeks  7-none, 6-rarely, 5-occ, 5-some times, 3-sev times, 2-most times, 1-every time 11/24/2014   Dyspnea for stairs, incline or hill 2  Chest Tightness 6  Worry about seriousness of lung complaint 5  Avoided doing things that make you dyspneic 5  Have you felt loss of control of lung condition (reversed from original) 6  Felt fed up due to lung condition 5  Felt urge to breathe aka air hunger 6  Has lung condition made you feel anxious 6  How often have you experienced wheezing or whistling sound 6  How much of the time have you felt your lung dz is getting worse 7  How much has your lung condition interfered with job or daily task 5  Were you expecting your lung condition to get worse 7  How much has your lung function limited you carrying things like groceris 6  How much has your lung function made you think of EOL? 6  Total 78  Are you financially worse off 6  Grand Total 84      has a past medical history of Hemorrhoid; Postural hypotension; Chronic bronchitis; RBBB (2010); Atelectasis; and Inguinal hernia.   reports that he quit smoking  about 62 years ago. His smoking use included Pipe and Cigars. He does not have any smokeless tobacco history on file.  Past Surgical History  Procedure Laterality Date  . Tonsillectomy  1935  . Left arm fracture  1950  . Cataract extraction      bilat  . Retinal detachment surgery      Bilat  . 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 Vasectomy  1972    Allergies  Allergen Reactions  . Peanut-Containing Drug Products     Cough,wheezing - GENERAL PEANUT ALLERGY  . Pirfenidone     Liver trouble  . Cialis [Tadalafil] Rash    Immunization History  Administered Date(s) Administered  . Influenza Split 11/25/2011, 01/02/2014  . Influenza,inj,Quad PF,36+ Mos 11/24/2012, 11/25/2014  . Pneumococcal-Unspecified 04/24/2012    Family History  Problem Relation Age of Onset  . Heart failure Father   . Heart failure Mother   . Macular degeneration Brother   . Macular degeneration Sister      Current outpatient prescriptions:  .  AZOPT 1 % ophthalmic suspension, Place 1 drop into the right eye 3 (three) times daily. , Disp: , Rfl:  .  Beta Sitosterol 40 % POWD, 1 tablet by Does not apply route 2 (two) times daily., Disp: , Rfl:  .  Cyanocobalamin (B-12) 2500 MCG SUBL, Place under the tongue daily., Disp: , Rfl:  .  Flaxseed, Linseed, (FLAX SEED OIL PO), Take by mouth daily., Disp: , Rfl:  .  LOTEMAX 0.5 % GEL, Place 1 drop into the right eye 2 (two) times daily. , Disp: , Rfl:  .  Nintedanib (OFEV) 100 MG CAPS, Take 100 mg by mouth 2 (two) times daily., Disp: 60 capsule, Rfl: 6 .  NON FORMULARY, [place] supplement twice daily, Disp: , Rfl:  .  ZIOPTAN 0.0015 % SOLN, Place 1 drop into the right eye daily., Disp: , Rfl:      Review of Systems     Objective:   Physical Exam  Constitutional: He is oriented to person, place, and time. He appears well-developed and well-nourished. No distress.  HENT:  Head: Normocephalic and atraumatic.  Right Ear: External ear normal.  Left Ear:  External ear normal.  Mouth/Throat:  Oropharynx is clear and moist. No oropharyngeal exudate.  Eyes: Conjunctivae and EOM are normal. Pupils are equal, round, and reactive to light. Right eye exhibits no discharge. Left eye exhibits no discharge. No scleral icterus.  Neck: Normal range of motion. Neck supple. No JVD present. No tracheal deviation present. No thyromegaly present.  Cardiovascular: Normal rate, regular rhythm and intact distal pulses.  Exam reveals no gallop and no friction rub.   No murmur heard. Pulmonary/Chest: Effort normal. No respiratory distress. He has no wheezes. He has rales. He exhibits no tenderness.  Abdominal: Soft. Bowel sounds are normal. He exhibits no distension and no mass. There is no tenderness. There is no rebound and no guarding.  Musculoskeletal: Normal range of motion. He exhibits no edema or tenderness.  Lymphadenopathy:    He has no cervical adenopathy.  Neurological: He is alert and oriented to person, place, and time. He has normal reflexes. No cranial nerve deficit. Coordination normal.  Skin: Skin is warm and dry. No rash noted. He is not diaphoretic. No erythema. No pallor.  Psychiatric: He has a normal mood and affect. His behavior is normal. Judgment and thought content normal.  Nursing note and vitals reviewed.  Filed Vitals:   09/12/15 1056  BP: 128/64  Pulse: 77  Height: 6\' 3"  (1.905 m)  Weight: 188 lb (85.276 kg)  SpO2: 96%       Assessment:       ICD-9-CM ICD-10-CM   1. Idiopathic pulmonary fibrosis (HCC) 516.31 J84.112   2. Encounter for therapeutic drug monitoring V58.83 Z51.81 Hepatic function panel        Plan:      Mild progression 8% of IPF in 2 years, 5.7% in 1 year, 0% in 6 months  Plan  - continue low dose ofev 100mg  twice daily  - do LFT check  Fu 6 months or sooner if needed  - Spirometry in office and walk at followup   [name], M.D., [facility].C.P Pulmonary and Critical Care Medicine Staff  Physician [facility] Pager: [phone], If no answer or between  15:00h - 7:00h: call [phone]  09/12/2015 11:30 AM

## 2016-02-04 ENCOUNTER — Telehealth: Payer: Self-pay | Admitting: Internal Medicine

## 2016-02-04 NOTE — Telephone Encounter (Signed)
Ok to write letter for pt

## 2016-02-05 NOTE — Telephone Encounter (Signed)
Ok to do letter. Use oxygen at take off and landing and in-flight and also for walking distances. He should be entitield for a cart   Dr. Kalman ShanMurali Sisto Granillo, M.D., [facility]C.P Pulmonary and Critical Care Medicine Staff Physician [facility] Pager: [phone], If no answer or between  15:00h - 7:00h: call [phone]  02/05/2016 5:08 PM

## 2016-02-05 NOTE — Telephone Encounter (Signed)
Letter has been written but due to power outage, unable to print. Will attempt to print and call patient tomorrow.

## 2016-02-06 NOTE — Telephone Encounter (Signed)
Patient returning call - left message with answering service today at 12:29pm - Left [phone] as call back number - pr

## 2016-02-06 NOTE — Telephone Encounter (Signed)
Letter printed, stamped and placed on the board in triage Called pt on home number and msg states "the person you are calling has blocked your phone number, goodbye" Called home number and LMTCB

## 2016-02-06 NOTE — Telephone Encounter (Signed)
Pt aware that letter has been placed up front in brown folder. Pt states he will come by tomorrow to pick this up. Pt voiced understanding and had no further questions. Nothing further needed.

## 2016-03-14 ENCOUNTER — Telehealth: Payer: Self-pay | Admitting: Internal Medicine

## 2016-03-14 MED ORDER — OSELTAMIVIR PHOSPHATE 75 MG PO CAPS: 75.0000 mg | ORAL_CAPSULE | Freq: Two times a day (BID) | ORAL | 0 refills | Status: DC

## 2016-03-14 MED ORDER — AZITHROMYCIN 250 MG PO TABS: ORAL_TABLET | ORAL | 0 refills | Status: AC

## 2016-03-14 MED ORDER — PREDNISONE 10 MG PO TABS: ORAL_TABLET | ORAL | 0 refills | Status: DC

## 2016-03-14 NOTE — Telephone Encounter (Signed)
Spoke with pt, who states he feels that he has developed the flu. Pt c/o dry cough, chest soreness, chills, sweats, feels that he has had a low grade temp (hasn't checked it), body aches & wheezing X3d Pt taking tylenol & left over cough syrup with codeine with no relief.  Pt also states he was supposed to leave for [place] today but had to reschedule due to his sickness.   Per MR verbally-- tamiflu 75mg  bid X5d, prednisone starting with 40mg  decreasing by 10mg  daily until reaching 0.5tab & zpak if not improving with tamiflu.  Pt aware of MR's recommendations. Rx sent to preferred pharmacy. Nothing further needed.

## 2016-04-22 ENCOUNTER — Ambulatory Visit (INDEPENDENT_AMBULATORY_CARE_PROVIDER_SITE_OTHER): Payer: Medicare Other | Admitting: Internal Medicine

## 2016-04-22 ENCOUNTER — Other Ambulatory Visit: Payer: Self-pay | Admitting: Internal Medicine

## 2016-04-22 ENCOUNTER — Ambulatory Visit: Payer: Medicare Other | Admitting: Internal Medicine

## 2016-04-22 DIAGNOSIS — R0600 Dyspnea, unspecified: Secondary | ICD-10-CM

## 2016-04-22 DIAGNOSIS — J84112 Idiopathic pulmonary fibrosis: Secondary | ICD-10-CM

## 2016-04-22 LAB — PULMONARY FUNCTION TEST
FEF 25-75 Pre: 2.34 L/sec
FEF2575-%Pred-Pre: 123 %
FEV1-%Pred-Pre: 92 %
FEV1-Pre: 2.72 L
FEV1FVC-%Pred-Pre: 113 %
FEV6-%PRED-PRE: 87 %
FEV6-PRE: 3.42 L
FEV6FVC-%Pred-Pre: 107 %
FVC-%PRED-PRE: 81 %
FVC-PRE: 3.44 L
PRE FEV1/FVC RATIO: 79 %
PRE FEV6/FVC RATIO: 100 %

## 2016-04-24 ENCOUNTER — Ambulatory Visit (INDEPENDENT_AMBULATORY_CARE_PROVIDER_SITE_OTHER): Payer: Medicare Other | Admitting: Internal Medicine

## 2016-04-24 ENCOUNTER — Encounter: Payer: Self-pay | Admitting: Internal Medicine

## 2016-04-24 ENCOUNTER — Other Ambulatory Visit (INDEPENDENT_AMBULATORY_CARE_PROVIDER_SITE_OTHER): Payer: Medicare Other

## 2016-04-24 DIAGNOSIS — J84112 Idiopathic pulmonary fibrosis: Secondary | ICD-10-CM

## 2016-04-24 LAB — HEPATIC FUNCTION PANEL
ALBUMIN: 4.3 g/dL (ref 3.5–5.2)
ALT: 22 U/L (ref 0–53)
AST: 19 U/L (ref 0–37)
Alkaline Phosphatase: 48 U/L (ref 39–117)
BILIRUBIN TOTAL: 0.3 mg/dL (ref 0.2–1.2)
Bilirubin, Direct: 0.1 mg/dL (ref 0.0–0.3)
TOTAL PROTEIN: 8 g/dL (ref 6.0–8.3)

## 2016-04-24 NOTE — Assessment & Plan Note (Signed)
8% progression in last 2.5 years and 1% in past 6 months but the 1% can be machine variation  Glad you are better from flu and getting back to baseline  Plan  - Continue oxygen with exertion - Continue low dose Ofev as before - Check liver function tests 04/24/2016  Follow-up - In 3 months do Pre-bd spiro and dlco only. No lung volume or bd response. No post-bd spiro - Return to see Dr. Marchelle Gearingamaswamy in 3 months or sooner if needed

## 2016-04-24 NOTE — Progress Notes (Signed)
Subjective:     Patient ID: Darrell Ware, male   DOB: [birth year], 83 y.o.   MRN: [id number]  HPI   OV 09/01/2014  Chief Complaint  Patient presents with  . Follow-up    Pt is a former [facility] pt. Pt states he was on OFEV but stoppped March 2016. Pt stated he rapidly declined since not being on the OFEV. When pt saw [facility] pt requested to be placed back on OFEV. Pt is currently on 150mg  BID. Pt c/o DOE, intermittent cough.    Follow-up idiopathic pulmonary fibrosis (diagnosis based on age, male sex, negative autoimmune profile and a CT scan of the chest April 2013]  This is a transfer of care. Patient is to be followed by Dr. Marcelyn BruinsKeith Clance and once by Dr. Clayton LefortV Sood for IPF. He is now following with me for Specialist IPF care. Presents with his wife both give history.  IPF diagnosed in spring of 2013. Then in the summer of 2014 through the expanded access program was on Pirfenidone but developed extremely high liver function anormlaity and had to be discontinued. Subsequently in the summer of 2015 was started on Nintedanib (Ofev) . He tolerated this well for 6 months. Then in the early part of 2016 started developing diarrhea with associated anorexia and weight loss. Symptoms are progressive. Symptoms ultimately became severe o he had abruptly discontinue the drug. Subsequently within a few days he started feeling dramatically better and all side effects resolved. However since that time he is having progressive dyspnea. He feels his interstitial lung disease is worse. A year ago overnight oxygen study and exertional pulse ox while he exercises on a recumbent bike at home showed no desaturation. However now for the last few months he is noticing desaturation into the 85% when he exercises. Correspondingly 2 days ago at our pulmonary office when he exerted he did desaturate to below 88%. He knows wants to restart his Ninetedanib ; wife wants to start him at low dose. He is currently taking old supply at the full  dose at 150 mg twice daily. He is open to attending pulmonary rehabilitation because his home exercise program has trailed off given his worsening symptoms. He is active in the pulmonary fibrosis foundation support group  His pulmonary function test within July 2015 when he was started on Ninntedanib and through March 2016 when he came off the drug have remained stable with moderate disease severity only. His FVC is 3.76 L/87%, total lung capacity is 5.12 L/66% and DLCO is 13.7/37%.    09/29/2014 Follow up : IPF and Chronic Hypoxic Resp Failure  Patient returns for a 4 week follow-up Patient was seen last visit with change of his pulmonary fibrosis medicine, OFEV , to lower dose to help with potential GI side effects. He is currently on 100 mg twice daily. He says he is tolerating well. Does have some mild nausea, but is tolerable. We discussed getting lab work today with LFTs. Patient was also started on oxygen with activity. He says this is really helped with his exercise regimen. He is waiting on his portable concentrator to be delivered. Order was checked on through his DME company today and has been approved. He has been referred to pulmonary rehabilitation, he is currently waiting for call for initiation of program. PFTs were repeated today that are similar to 2013. FVC 88%, FEV100%, ratio 80, diffusing capacity 38%. Patient says overall he feels that he is improved. He does get fatigued but feels that  this is slightly improved. He tries to exercise daily. He travels quite a bit with his wife. Is planning a trip to [place] in February.    OV 11/24/2014   Chief Complaint  Patient presents with  . Follow-up    pt. states breathing is baseline. SOB at baseline. dry cough is baseline. denies chest pain/tightness.   Follow-up idiopathic pulmonary fibrosis - on low dose ofev due to gi side effects   He presents with his wife. He is tolerating the low-dose of Ofev quite well. Does not have  any GI side effects. Overall he and his wife attest that his pulmonary health is stable. Looking at the interstitial lung disease questionnaire below it is obvious that he has significant dyspnea when he climbs stairs or hill but otherwise is a high performing individual. He is planning a trip in [place] in January this is an annual winter trip where he goes for 2 weeks. Otherwise no new problems. He was attending the pulmonary fibrosis foundation support group yesterday. He uses oxygen with exertion   Last the function test August 2016 was normal this is for monitoring on Ofev which is needed every 3 months  OV 02/14/2015  Chief Complaint  Patient presents with  . Follow-up    Pt states his breathing is unchanged since last OV. Pt states he feels a slight decline in breathing since one year ago - DOE. Pt c/o dry cough and intermittent chest discomfort.    Follow-up idiopathic pulmonary fibrosis-on nintedanib since summer 2015 and on low-dose since July 2016 seen due to GI side effects [hepatotoxicity with Esbriet 2014]  He has completed pulmonary rehabilitation. He says he learned a lot. His 6 minute walk distance improved with rehabilitation. He feels stable. However the rehabilitation specialist several options oxygen with exertion to 4 L. He does not think this is due to her decline. He thinks this is more due to his baseline requirement. Liver function test yesterday was normal. Spirometry today is FVC 3.5 L/72% and it is a decline since August 2016 PFT but as is often the case in our practice the PFT and spirometry is do not fully correlate. So I will taken at face value that he is stable. He is due to go to [place] in January 2017 and he wanted me to fill out paperwork. He wants to use the oxygen in flight and during landing.   OV 09/12/2015  Chief Complaint  Patient presents with  . Follow-up    review pft.  pt states his breathing is unchanged- SOB with exertion.    Follow-up idiopathic  pulmonary fibrosis: is-on nintedanib since summer 2015 and on low-dose since July 2016 seen due to GI side effects [hepatotoxicity with Esbriet 2014]  Last seen December 2016. After that he went 5 weeks to [place]. He and his wife enjoyed themselves a lot. This is an annual condition for them. Overall he is reporting stability in the last 6 months. However compared to a year or 2 ago he feels the dz  slowly gradually progressive. In fact on forced vital capacity he's had 8% progression in that in 2 years and 5.7% progression in 1 year with 0% progression in 6 months. His FVC and July 2015 was 3.89 he did in August 2016 it was 3.8. In December 2016 was 3.5 and currently it is 3.58. There is also some progression and DLCO over time. He is fully active. Last liver function test was December 2016.    OV 04/24/2016  Chief Complaint  Patient presents with  . Follow-up    Pt states he got the flu in mid Jan 2018 and states he does not feel back to baseline. Pt c/o cough but states this is due to eating peanuts the other day. Pt denies CP/tightness and f/c/s.    Follow-up idiopathic pulmonary fibrosis on low-dose Ofev with prior history of hepatotoxicity with Pirfenidone (Esbriet)  Mid-January 2018 he had influenza and was treated outpatient basis with prednisone and Tamiflu. Since then he slowly recovered and is almost at his baseline. He did go to his annual [place] trip. He did use oxygen on flight. He uses oxygen on with exertion and when and air borne. - at end dropped to 87%; few days ago. In terms of his symptoms compared to last summer he feels overall stable except for the interim influenza. However in comparing the interstitial lung disease questionnaire from one half years ago scoring shows slight decline. Pulmicort function tests also show slight decline over time but only 1% variation decline compared to 6 months ago.  K-BILD ILD QUESTIONNAIRE, Symptom score over prior 2 weeks  7-none,  6-rarely, 5-occ, 5-some times, 3-sev times, 2-most times, 1-every time 11/24/2014  04/24/2016   Dyspnea for stairs, incline or hill 2 2  Chest Tightness 6 4  Worry about seriousness of lung complaint 5 5  Avoided doing things that make you dyspneic 5 6  Have you felt loss of control of lung condition (reversed from original) 6 6  Felt fed up due to lung condition 5 6  Felt urge to breathe aka air hunger 6 6  Has lung condition made you feel anxious 6 6  How often have you experienced wheezing or whistling sound 6 5  How much of the time have you felt your lung dz is getting worse 7 6  How much has your lung condition interfered with job or daily task 5 5  Were you expecting your lung condition to get worse 7 6  How much has your lung function limited you carrying things like groceris 6 5  How much has your lung function made you think of EOL? 6 6  Total 78 68  Are you financially worse off 6 6  Grand Total 84 72   Results for Darrell Ware, Darrell Ware (MRN [id number]) as of 04/24/2016 11:38  Ref. Range 09/29/2014 11:51 02/14/2015 17:28 09/12/2015 10:46 04/22/2016 09:30  FVC-Pre Latest Units: L 3.80  3.49 3.44  FVC-%Pred-Pre Latest Units: % 88  82 81  FEV1-Pre Latest Units: L 3.02  2.75 2.72  FEV1-%Pred-Pre Latest Units: % 100  92 92   Review of Systems     Objective:   Physical Exam  Constitutional: He is oriented to person, place, and time. He appears well-developed and well-nourished. No distress.  HENT:  Head: Normocephalic and atraumatic.  Right Ear: External ear normal.  Left Ear: External ear normal.  Mouth/Throat: Oropharynx is clear and moist. No oropharyngeal exudate.  Eyes: Conjunctivae and EOM are normal. Pupils are equal, round, and reactive to light. Right eye exhibits no discharge. Left eye exhibits no discharge. No scleral icterus.  Neck: Normal range of motion. Neck supple. No JVD present. No tracheal deviation present. No thyromegaly present.  Cardiovascular: Normal rate,  regular rhythm and intact distal pulses.  Exam reveals no gallop and no friction rub.   No murmur heard. Pulmonary/Chest: Effort normal. No respiratory distress. He has no wheezes. He has rales. He exhibits no tenderness.  Abdominal:  Soft. Bowel sounds are normal. He exhibits no distension and no mass. There is no tenderness. There is no rebound and no guarding.  Musculoskeletal: Normal range of motion. He exhibits no edema or tenderness.  Lymphadenopathy:    He has no cervical adenopathy.  Neurological: He is alert and oriented to person, place, and time. He has normal reflexes. No cranial nerve deficit. Coordination normal.  Skin: Skin is warm and dry. No rash noted. He is not diaphoretic. No erythema. No pallor.  Psychiatric: He has a normal mood and affect. His behavior is normal. Judgment and thought content normal.  Nursing note and vitals reviewed.   Vitals:   04/24/16 1116  BP: (!) 108/54  Pulse: 73  SpO2: 97%  Weight: 186 lb 3.2 oz (84.5 kg)  Height: 6\' 3"  (1.905 m)    Estimated body mass index is 23.27 kg/m as calculated from the following:   Height as of this encounter: 6\' 3"  (1.905 m).   Weight as of this encounter: 186 lb 3.2 oz (84.5 kg).      Assessment:       ICD-9-CM ICD-10-CM   1. Idiopathic pulmonary fibrosis (HCC) 516.31 J84.112        Plan:     Idiopathic pulmonary fibrosis 8% progression in last 2.5 years and 1% in past 6 months but the 1% can be machine variation  Glad you are better from flu and getting back to baseline  Plan  - Continue oxygen with exertion - Continue low dose Ofev as before - Check liver function tests 04/24/2016  Follow-up - In 3 months do Pre-bd spiro and dlco only. No lung volume or bd response. No post-bd spiro - Return to see Dr. Marchelle Gearing in 3 months or sooner if needed   [name], M.D., [facility].C.P Pulmonary and Critical Care Medicine Staff Physician [facility] Pager: [phone], If no answer or between  15:00h - 7:00h: call [phone]  04/24/2016 11:58 AM

## 2016-04-24 NOTE — Patient Instructions (Signed)
Idiopathic pulmonary fibrosis 8% progression in last 2.5 years and 1% in past 6 months but the 1% can be machine variation  Glad you are better from flu and getting back to baseline  Plan  - Continue oxygen with exertion - Continue low dose Ofev as before - Check liver function tests 04/24/2016  Follow-up - In 3 months do Pre-bd spiro and dlco only. No lung volume or bd response. No post-bd spiro - Return to see Dr. Marchelle Gearingamaswamy in 3 months or sooner if needed

## 2016-05-02 NOTE — Progress Notes (Signed)
pft  

## 2016-05-02 NOTE — Progress Notes (Signed)
6mwd 

## 2016-07-25 ENCOUNTER — Ambulatory Visit (INDEPENDENT_AMBULATORY_CARE_PROVIDER_SITE_OTHER): Payer: Medicare Other | Admitting: Internal Medicine

## 2016-07-25 ENCOUNTER — Other Ambulatory Visit (INDEPENDENT_AMBULATORY_CARE_PROVIDER_SITE_OTHER): Payer: Medicare Other

## 2016-07-25 ENCOUNTER — Encounter: Payer: Self-pay | Admitting: Internal Medicine

## 2016-07-25 VITALS — BP 116/58 | HR 65 | Ht 75.0 in | Wt 185.0 lb

## 2016-07-25 DIAGNOSIS — R062 Wheezing: Secondary | ICD-10-CM | POA: Insufficient documentation

## 2016-07-25 DIAGNOSIS — Z5181 Encounter for therapeutic drug level monitoring: Secondary | ICD-10-CM

## 2016-07-25 DIAGNOSIS — J84112 Idiopathic pulmonary fibrosis: Secondary | ICD-10-CM | POA: Diagnosis not present

## 2016-07-25 LAB — PULMONARY FUNCTION TEST
DL/VA % pred: 53 %
DL/VA: 2.51 ml/min/mmHg/L
DLCO UNC: 12.17 ml/min/mmHg
DLCO cor % pred: 33 %
DLCO cor: 12.03 ml/min/mmHg
DLCO unc % pred: 33 %
FEF 25-75 Pre: 2.37 L/sec
FEF2575-%PRED-PRE: 125 %
FEV1-%PRED-PRE: 90 %
FEV1-PRE: 2.67 L
FEV1FVC-%Pred-Pre: 113 %
FEV6-%Pred-Pre: 85 %
FEV6-Pre: 3.35 L
FEV6FVC-%Pred-Pre: 106 %
FVC-%Pred-Pre: 80 %
FVC-PRE: 3.37 L
PRE FEV1/FVC RATIO: 79 %
Pre FEV6/FVC Ratio: 99 %

## 2016-07-25 LAB — NITRIC OXIDE: Nitric Oxide: 39

## 2016-07-25 LAB — HEPATIC FUNCTION PANEL
ALBUMIN: 4.3 g/dL (ref 3.5–5.2)
ALT: 25 U/L (ref 0–53)
AST: 21 U/L (ref 0–37)
Alkaline Phosphatase: 50 U/L (ref 39–117)
Bilirubin, Direct: 0.1 mg/dL (ref 0.0–0.3)
TOTAL PROTEIN: 7.9 g/dL (ref 6.0–8.3)
Total Bilirubin: 0.4 mg/dL (ref 0.2–1.2)

## 2016-07-25 MED ORDER — FLUTICASONE FUROATE 100 MCG/ACT IN AEPB: 1.0000 | INHALATION_SPRAY | Freq: Every day | RESPIRATORY_TRACT | 0 refills | Status: AC

## 2016-07-25 NOTE — Assessment & Plan Note (Addendum)
Nitric oxide test is 39ppb (< 25 is normal, > 50 is abnormal) Possible asthma coexistent  Plan Start ARNUITY inhaler - call us back in 2 weeks with response  FOllowup 3 months followup- sooner if needed

## 2016-07-25 NOTE — Patient Instructions (Signed)
Idiopathic pulmonary fibrosis 3% decline in lung function in 1 year   Plan Continue ofev lower dose 100mg  twice daily Check LFT on ofev 07/25/2016  Use o2 with exertion  Followup 3 months or sooner if needed   Wheezing symptom Nitric oxide test is 39ppb (< 25 is normal, > 50 is abnormal) Possible asthma  Plan Start ARNUITY inhaler - call us back in 2 weeks with response  FOllowup 3 months followup- sooner if needed

## 2016-07-25 NOTE — Progress Notes (Signed)
Patient seen in the office today and instructed on use of Arnuity 100mcg.  Patient expressed understanding and demonstrated technique. Maisie FusAshtyn M Green, CMA

## 2016-07-25 NOTE — Progress Notes (Signed)
PFT done today. 

## 2016-07-25 NOTE — Progress Notes (Signed)
Subjective:     Patient ID: Darrell Ware, male   DOB: [birth year], 83 y.o.   MRN: [id number]  HPI     OV 09/01/2014  Chief Complaint  Patient presents with  . Follow-up    Pt is a former [facility] pt. Pt states he was on OFEV but stoppped March 2016. Pt stated he rapidly declined since not being on the OFEV. When pt saw [facility] pt requested to be placed back on OFEV. Pt is currently on 150mg  BID. Pt c/o DOE, intermittent cough.    Follow-up idiopathic pulmonary fibrosis (diagnosis based on age, male sex, negative autoimmune profile and a CT scan of the chest April 2013]  This is a transfer of care. Patient is to be followed by Dr. Marcelyn BruinsKeith Clance and once by Dr. Clayton LefortV Sood for IPF. He is now following with me for Specialist IPF care. Presents with his wife both give history.  IPF diagnosed in spring of 2013. Then in the summer of 2014 through the expanded access program was on Pirfenidone but developed extremely high liver function anormlaity and had to be discontinued. Subsequently in the summer of 2015 was started on Nintedanib (Ofev) . He tolerated this well for 6 months. Then in the early part of 2016 started developing diarrhea with associated anorexia and weight loss. Symptoms are progressive. Symptoms ultimately became severe o he had abruptly discontinue the drug. Subsequently within a few days he started feeling dramatically better and all side effects resolved. However since that time he is having progressive dyspnea. He feels his interstitial lung disease is worse. A year ago overnight oxygen study and exertional pulse ox while he exercises on a recumbent bike at home showed no desaturation. However now for the last few months he is noticing desaturation into the 85% when he exercises. Correspondingly 2 days ago at our pulmonary office when he exerted he did desaturate to below 88%. He knows wants to restart his Ninetedanib ; wife wants to start him at low dose. He is currently taking old supply at the  full dose at 150 mg twice daily. He is open to attending pulmonary rehabilitation because his home exercise program has trailed off given his worsening symptoms. He is active in the pulmonary fibrosis foundation support group  His pulmonary function test within July 2015 when he was started on Ninntedanib and through March 2016 when he came off the drug have remained stable with moderate disease severity only. His FVC is 3.76 L/87%, total lung capacity is 5.12 L/66% and DLCO is 13.7/37%.    09/29/2014 Follow up : IPF and Chronic Hypoxic Resp Failure  Patient returns for a 4 week follow-up Patient was seen last visit with change of his pulmonary fibrosis medicine, OFEV , to lower dose to help with potential GI side effects. He is currently on 100 mg twice daily. He says he is tolerating well. Does have some mild nausea, but is tolerable. We discussed getting lab work today with LFTs. Patient was also started on oxygen with activity. He says this is really helped with his exercise regimen. He is waiting on his portable concentrator to be delivered. Order was checked on through his DME company today and has been approved. He has been referred to pulmonary rehabilitation, he is currently waiting for call for initiation of program. PFTs were repeated today that are similar to 2013. FVC 88%, FEV100%, ratio 80, diffusing capacity 38%. Patient says overall he feels that he is improved. He does get fatigued but  feels that this is slightly improved. He tries to exercise daily. He travels quite a bit with his wife. Is planning a trip to [place] in February.    OV 11/24/2014   Chief Complaint  Patient presents with  . Follow-up    pt. states breathing is baseline. SOB at baseline. dry cough is baseline. denies chest pain/tightness.   Follow-up idiopathic pulmonary fibrosis - on low dose ofev due to gi side effects   He presents with his wife. He is tolerating the low-dose of Ofev quite well. Does not  have any GI side effects. Overall he and his wife attest that his pulmonary health is stable. Looking at the interstitial lung disease questionnaire below it is obvious that he has significant dyspnea when he climbs stairs or hill but otherwise is a high performing individual. He is planning a trip in [place] in January this is an annual winter trip where he goes for 2 weeks. Otherwise no new problems. He was attending the pulmonary fibrosis foundation support group yesterday. He uses oxygen with exertion   Last the function test August 2016 was normal this is for monitoring on Ofev which is needed every 3 months  OV 02/14/2015  Chief Complaint  Patient presents with  . Follow-up    Pt states his breathing is unchanged since last OV. Pt states he feels a slight decline in breathing since one year ago - DOE. Pt c/o dry cough and intermittent chest discomfort.    Follow-up idiopathic pulmonary fibrosis-on nintedanib since summer 2015 and on low-dose since July 2016 seen due to GI side effects [hepatotoxicity with Esbriet 2014]  He has completed pulmonary rehabilitation. He says he learned a lot. His 6 minute walk distance improved with rehabilitation. He feels stable. However the rehabilitation specialist several options oxygen with exertion to 4 L. He does not think this is due to her decline. He thinks this is more due to his baseline requirement. Liver function test yesterday was normal. Spirometry today is FVC 3.5 L/72% and it is a decline since August 2016 PFT but as is often the case in our practice the PFT and spirometry is do not fully correlate. So I will taken at face value that he is stable. He is due to go to [place] in January 2017 and he wanted me to fill out paperwork. He wants to use the oxygen in flight and during landing.   OV 09/12/2015  Chief Complaint  Patient presents with  . Follow-up    review pft.  pt states his breathing is unchanged- SOB with exertion.    Follow-up  idiopathic pulmonary fibrosis: is-on nintedanib since summer 2015 and on low-dose since July 2016 seen due to GI side effects [hepatotoxicity with Esbriet 2014]  Last seen December 2016. After that he went 5 weeks to [place]. He and his wife enjoyed themselves a lot. This is an annual condition for them. Overall he is reporting stability in the last 6 months. However compared to a year or 2 ago he feels the dz  slowly gradually progressive. In fact on forced vital capacity he's had 8% progression in that in 2 years and 5.7% progression in 1 year with 0% progression in 6 months. His FVC and July 2015 was 3.89 he did in August 2016 it was 3.8. In December 2016 was 3.5 and currently it is 3.58. There is also some progression and DLCO over time. He is fully active. Last liver function test was December 2016.  OV 04/24/2016  Chief Complaint  Patient presents with  . Follow-up    Pt states he got the flu in mid Jan 2018 and states he does not feel back to baseline. Pt c/o cough but states this is due to eating peanuts the other day. Pt denies CP/tightness and f/c/s.    Follow-up idiopathic pulmonary fibrosis on low-dose Ofev with prior history of hepatotoxicity with Pirfenidone (Esbriet)  Mid-January 2018 he had influenza and was treated outpatient basis with prednisone and Tamiflu. Since then he slowly recovered and is almost at his baseline. He did go to his annual [place] trip. He did use oxygen on flight. He uses oxygen on with exertion and when and air borne. - at end dropped to 87%; few days ago. In terms of his symptoms compared to last summer he feels overall stable except for the interim influenza. However in comparing the interstitial lung disease questionnaire from one half years ago scoring shows slight decline. Pulmicort function tests also show slight decline over time but only 1% variation decline compared to 6 months ago.   OV 07/25/2016  Chief Complaint  Patient presents with  .  Follow-up    review pft.  pt states he is doing well, notes low bp.     Follow-up idiopathic pulmonary fibrosis (diagnosis based on age, male sex, negative autoimmune profile and a CT scan of the chest April   For folllwup. Overall stable but mothers day 2018 got exposed to huge pollen load while helping son with car dent. Then had low bp, cough and wheeze and fatigute but now slowly getting better but stil with occ wheeze. Tolerating ofev lower dose just fine. Overall compared to a year ago he feels he has progressed somewhat. FVC decline in 1 year is 3%. Due to hx of wheeze we did feno and is 30s ppb and elevated     K-BILD ILD QUESTIONNAIRE, Symptom score over prior 2 weeks  7-none, 6-rarely, 5-occ, 5-some times, 3-sev times, 2-most times, 1-every time 11/24/2014  04/24/2016   Dyspnea for stairs, incline or hill 2 2  Chest Tightness 6 4  Worry about seriousness of lung complaint 5 5  Avoided doing things that make you dyspneic 5 6  Have you felt loss of control of lung condition (reversed from original) 6 6  Felt fed up due to lung condition 5 6  Felt urge to breathe aka air hunger 6 6  Has lung condition made you feel anxious 6 6  How often have you experienced wheezing or whistling sound 6 5  How much of the time have you felt your lung dz is getting worse 7 6  How much has your lung condition interfered with job or daily task 5 5  Were you expecting your lung condition to get worse 7 6  How much has your lung function limited you carrying things like groceris 6 5  How much has your lung function made you think of EOL? 6 6  Total 78 68  Are you financially worse off 6 6  Grand Total 84 72     Results for JAMONT, MELLIN (MRN [id number]) as of 07/25/2016 12:36  Ref. Range 05/11/2014 09:54 09/29/2014 11:51 09/12/2015 10:46 04/22/2016 09:30 07/25/2016 11:19  FVC-Pre Latest Units: L 3.76 3.80 3.49 3.44 3.37  FVC-%Pred-Pre Latest Units: % 87 88 82 81 80   Results for ROBBIE, RIDEAUX (MRN  [id number]) as of 07/25/2016 12:36  Ref. Range 05/11/2014 09:54 09/29/2014 11:51 09/12/2015  10:46 04/22/2016 09:30 07/25/2016 11:19  DLCO unc Latest Units: ml/min/mmHg 13.71 13.83 13.39  12.17  DLCO unc % pred Latest Units: % 37 38 36  33      has a past medical history of Atelectasis; Chronic bronchitis; Hemorrhoid; Inguinal hernia; Postural hypotension; and RBBB (2010).   reports that he quit smoking about 63 years ago. His smoking use included Pipe and Cigars. He has a 0.40 pack-year smoking history. He has never used smokeless tobacco.  Past Surgical History:  Procedure Laterality Date  . CATARACT EXTRACTION     bilat  . CORNEAL TRANSPLANT  1999   Right  . left arm fracture  1950  . RETINAL DETACHMENT SURGERY     Bilat  . TONSILLECTOMY  1935  . VASECTOMY  1972    Allergies  Allergen Reactions  . Peanut-Containing Drug Products     Cough,wheezing - GENERAL PEANUT ALLERGY  . Pirfenidone     Liver trouble  . Cialis [Tadalafil] Rash    Immunization History  Administered Date(s) Administered  . Influenza Split 11/25/2011, 01/02/2014  . Influenza, High Dose Seasonal PF 11/25/2015  . Influenza,inj,Quad PF,36+ Mos 11/24/2012, 11/25/2014  . Pneumococcal-Unspecified 04/24/2012    Family History  Problem Relation Age of Onset  . Heart failure Father   . Heart failure Mother   . Macular degeneration Brother   . Macular degeneration Sister      Current Outpatient Prescriptions:  .  AZOPT 1 % ophthalmic suspension, Place 1 drop into the right eye 3 (three) times daily. , Disp: , Rfl:  .  Beta Sitosterol 40 % POWD, 1 tablet by Does not apply route 2 (two) times daily., Disp: , Rfl:  .  Cyanocobalamin (B-12) 2500 MCG SUBL, Place under the tongue daily., Disp: , Rfl:  .  Flaxseed, Linseed, (FLAX SEED OIL PO), Take by mouth daily., Disp: , Rfl:  .  LOTEMAX 0.5 % GEL, Place 1 drop into the right eye 2 (two) times daily. , Disp: , Rfl:  .  Nintedanib (OFEV) 100 MG CAPS, Take 100 mg by  mouth 2 (two) times daily., Disp: 60 capsule, Rfl: 6 .  NON FORMULARY, [place] supplement twice daily, Disp: , Rfl:  .  ZIOPTAN 0.0015 % SOLN, Place 1 drop into the right eye daily., Disp: , Rfl:    Review of Systems     Objective:   Physical Exam  Constitutional: He is oriented to person, place, and time. He appears well-developed and well-nourished. No distress.  HENT:  Head: Normocephalic and atraumatic.  Right Ear: External ear normal.  Left Ear: External ear normal.  Mouth/Throat: Oropharynx is clear and moist. No oropharyngeal exudate.  Eyes: Conjunctivae and EOM are normal. Pupils are equal, round, and reactive to light. Right eye exhibits no discharge. Left eye exhibits no discharge. No scleral icterus.  Neck: Normal range of motion. Neck supple. No JVD present. No tracheal deviation present. No thyromegaly present.  Cardiovascular: Normal rate, regular rhythm and intact distal pulses.  Exam reveals no gallop and no friction rub.   No murmur heard. Pulmonary/Chest: Effort normal. No respiratory distress. He has no wheezes. He has rales. He exhibits no tenderness.  Mild labored breathing when talking a lot  Abdominal: Soft. Bowel sounds are normal. He exhibits no distension and no mass. There is no tenderness. There is no rebound and no guarding.  Musculoskeletal: Normal range of motion. He exhibits no edema or tenderness.  Lymphadenopathy:    He has no cervical adenopathy.  Neurological: He is alert and oriented to person, place, and time. He has normal reflexes. No cranial nerve deficit. Coordination normal.  Skin: Skin is warm and dry. No rash noted. He is not diaphoretic. No erythema. No pallor.  Psychiatric: He has a normal mood and affect. His behavior is normal. Judgment and thought content normal.  Nursing note and vitals reviewed.  Vitals:   07/25/16 1219  BP: (!) 116/58  Pulse: 65  SpO2: 96%  Weight: 185 lb (83.9 kg)  Height: 6\' 3"  (1.905 m)       Assessment:        ICD-9-CM ICD-10-CM   1. Idiopathic pulmonary fibrosis (HCC) 516.31 J84.112   2. Wheezing symptom 786.07 R06.2        Plan:     Idiopathic pulmonary fibrosis 3% decline in lung function in 1 year   Plan Continue ofev lower dose 100mg  twice daily Check LFT on ofev 07/25/2016  Use o2 with exertion  Followup 3 months or sooner if needed   Wheezing symptom Nitric oxide test is 39ppb (< 25 is normal, > 50 is abnormal) Possible asthma coexistent  Plan Start ARNUITY inhaler - call us back in 2 weeks with response  FOllowup 3 months followup- sooner if needed     .rmsig

## 2016-07-25 NOTE — Assessment & Plan Note (Addendum)
3% decline in lung function in 1 year   Plan Continue ofev lower dose 100mg  twice daily Check LFT on ofev 07/25/2016  Use o2 with exertion  Followup 3 months or sooner if needed

## 2016-08-04 NOTE — Progress Notes (Signed)
Spoke with pt and notified of results per Dr. Wert. Pt verbalized understanding and denied any questions. 

## 2016-11-04 ENCOUNTER — Encounter: Payer: Self-pay | Admitting: Internal Medicine

## 2016-11-04 ENCOUNTER — Ambulatory Visit (INDEPENDENT_AMBULATORY_CARE_PROVIDER_SITE_OTHER): Payer: Medicare Other | Admitting: Internal Medicine

## 2016-11-04 VITALS — BP 130/70 | HR 75 | Ht 73.0 in | Wt 184.4 lb

## 2016-11-04 DIAGNOSIS — J84112 Idiopathic pulmonary fibrosis: Secondary | ICD-10-CM | POA: Diagnosis not present

## 2016-11-04 NOTE — Patient Instructions (Signed)
Idiopathic pulmonary fibrosis 3% decline in lung function in 1 year as of June 2018 but seems stable 11/04/2016 since June 2018  Plan Continue ofev lower dose 100mg  twice daily Use o2 with exertion Flu shot next month with PCP Stoneking, Hal, MD; recommend high dose  Followup In 3 months with Pre-bd spiro and dlco only. No lung volume or bd response. No post-bd spiro Return to see me in 3 months or sooner if needed  - LFT at followup  - KBILD at followup

## 2016-11-04 NOTE — Progress Notes (Signed)
Subjective:     Patient ID: Darrell Ware, male   DOB: [birth year], 83 y.o.   MRN: [id number]  HPI   OV 09/01/2014  Chief Complaint  Patient presents with  . Follow-up    Pt is a former [facility] pt. Pt states he was on OFEV but stoppped March 2016. Pt stated he rapidly declined since not being on the OFEV. When pt saw [facility] pt requested to be placed back on OFEV. Pt is currently on 150mg  BID. Pt c/o DOE, intermittent cough.    Follow-up idiopathic pulmonary fibrosis (diagnosis based on age, male sex, negative autoimmune profile and a CT scan of the chest April 2013]  This is a transfer of care. Patient is to be followed by Dr. Marcelyn BruinsKeith Clance and once by Dr. Clayton LefortV Sood for IPF. He is now following with me for Specialist IPF care. Presents with his wife both give history.  IPF diagnosed in spring of 2013. Then in the summer of 2014 through the expanded access program was on Pirfenidone but developed extremely high liver function anormlaity and had to be discontinued. Subsequently in the summer of 2015 was started on Nintedanib (Ofev) . He tolerated this well for 6 months. Then in the early part of 2016 started developing diarrhea with associated anorexia and weight loss. Symptoms are progressive. Symptoms ultimately became severe o he had abruptly discontinue the drug. Subsequently within a few days he started feeling dramatically better and all side effects resolved. However since that time he is having progressive dyspnea. He feels his interstitial lung disease is worse. A year ago overnight oxygen study and exertional pulse ox while he exercises on a recumbent bike at home showed no desaturation. However now for the last few months he is noticing desaturation into the 85% when he exercises. Correspondingly 2 days ago at our pulmonary office when he exerted he did desaturate to below 88%. He knows wants to restart his Ninetedanib ; wife wants to start him at low dose. He is currently taking old supply at the full  dose at 150 mg twice daily. He is open to attending pulmonary rehabilitation because his home exercise program has trailed off given his worsening symptoms. He is active in the pulmonary fibrosis foundation support group  His pulmonary function test within July 2015 when he was started on Ninntedanib and through March 2016 when he came off the drug have remained stable with moderate disease severity only. His FVC is 3.76 L/87%, total lung capacity is 5.12 L/66% and DLCO is 13.7/37%.    09/29/2014 Follow up : IPF and Chronic Hypoxic Resp Failure  Patient returns for a 4 week follow-up Patient was seen last visit with change of his pulmonary fibrosis medicine, OFEV , to lower dose to help with potential GI side effects. He is currently on 100 mg twice daily. He says he is tolerating well. Does have some mild nausea, but is tolerable. We discussed getting lab work today with LFTs. Patient was also started on oxygen with activity. He says this is really helped with his exercise regimen. He is waiting on his portable concentrator to be delivered. Order was checked on through his DME company today and has been approved. He has been referred to pulmonary rehabilitation, he is currently waiting for call for initiation of program. PFTs were repeated today that are similar to 2013. FVC 88%, FEV100%, ratio 80, diffusing capacity 38%. Patient says overall he feels that he is improved. He does get fatigued but feels that  this is slightly improved. He tries to exercise daily. He travels quite a bit with his wife. Is planning a trip to [place] in February.    OV 11/24/2014   Chief Complaint  Patient presents with  . Follow-up    pt. states breathing is baseline. SOB at baseline. dry cough is baseline. denies chest pain/tightness.   Follow-up idiopathic pulmonary fibrosis - on low dose ofev due to gi side effects   He presents with his wife. He is tolerating the low-dose of Ofev quite well. Does not have  any GI side effects. Overall he and his wife attest that his pulmonary health is stable. Looking at the interstitial lung disease questionnaire below it is obvious that he has significant dyspnea when he climbs stairs or hill but otherwise is a high performing individual. He is planning a trip in [place] in January this is an annual winter trip where he goes for 2 weeks. Otherwise no new problems. He was attending the pulmonary fibrosis foundation support group yesterday. He uses oxygen with exertion   Last the function test August 2016 was normal this is for monitoring on Ofev which is needed every 3 months  OV 02/14/2015  Chief Complaint  Patient presents with  . Follow-up    Pt states his breathing is unchanged since last OV. Pt states he feels a slight decline in breathing since one year ago - DOE. Pt c/o dry cough and intermittent chest discomfort.    Follow-up idiopathic pulmonary fibrosis-on nintedanib since summer 2015 and on low-dose since July 2016 seen due to GI side effects [hepatotoxicity with Esbriet 2014]  He has completed pulmonary rehabilitation. He says he learned a lot. His 6 minute walk distance improved with rehabilitation. He feels stable. However the rehabilitation specialist several options oxygen with exertion to 4 L. He does not think this is due to her decline. He thinks this is more due to his baseline requirement. Liver function test yesterday was normal. Spirometry today is FVC 3.5 L/72% and it is a decline since August 2016 PFT but as is often the case in our practice the PFT and spirometry is do not fully correlate. So I will taken at face value that he is stable. He is due to go to [place] in January 2017 and he wanted me to fill out paperwork. He wants to use the oxygen in flight and during landing.   OV 09/12/2015  Chief Complaint  Patient presents with  . Follow-up    review pft.  pt states his breathing is unchanged- SOB with exertion.    Follow-up idiopathic  pulmonary fibrosis: is-on nintedanib since summer 2015 and on low-dose since July 2016 seen due to GI side effects [hepatotoxicity with Esbriet 2014]  Last seen December 2016. After that he went 5 weeks to [place]. He and his wife enjoyed themselves a lot. This is an annual condition for them. Overall he is reporting stability in the last 6 months. However compared to a year or 2 ago he feels the dz  slowly gradually progressive. In fact on forced vital capacity he's had 8% progression in that in 2 years and 5.7% progression in 1 year with 0% progression in 6 months. His FVC and July 2015 was 3.89 he did in August 2016 it was 3.8. In December 2016 was 3.5 and currently it is 3.58. There is also some progression and DLCO over time. He is fully active. Last liver function test was December 2016.    OV 04/24/2016  Chief Complaint  Patient presents with  . Follow-up    Pt states he got the flu in mid Jan 2018 and states he does not feel back to baseline. Pt c/o cough but states this is due to eating peanuts the other day. Pt denies CP/tightness and f/c/s.    Follow-up idiopathic pulmonary fibrosis on low-dose Ofev with prior history of hepatotoxicity with Pirfenidone (Esbriet)  Mid-January 2018 he had influenza and was treated outpatient basis with prednisone and Tamiflu. Since then he slowly recovered and is almost at his baseline. He did go to his annual [place] trip. He did use oxygen on flight. He uses oxygen on with exertion and when and air borne. - at end dropped to 87%; few days ago. In terms of his symptoms compared to last summer he feels overall stable except for the interim influenza. However in comparing the interstitial lung disease questionnaire from one half years ago scoring shows slight decline. Pulmicort function tests also show slight decline over time but only 1% variation decline compared to 6 months ago.   OV 07/25/2016  Chief Complaint  Patient presents with  . Follow-up     review pft.  pt states he is doing well, notes low bp.     Follow-up idiopathic pulmonary fibrosis (diagnosis based on age, male sex, negative autoimmune profile and a CT scan of the chest April   For folllwup. Overall stable but mothers day 2018 got exposed to huge pollen load while helping son with car dent. Then had low bp, cough and wheeze and fatigute but now slowly getting better but stil with occ wheeze. Tolerating ofev lower dose just fine. Overall compared to a year ago he feels he has progressed somewhat. FVC decline in 1 year is 3%. Due to hx of wheeze we did feno and is 30s ppb and elevated     OV 11/04/2016  Chief Complaint  Patient presents with  . Follow-up    Pt complains of no symptoms other than SOB [place] Pt has non productive dry cough.     idiopathic pulmonary fibrosis follow-up. He is on low dose Ofev. Diagnosis greater than 5 years ago. He said 3% decline in lung function as of March/June 2018. Last visit was in June 2018. He continues on his Ofev 100 mg twice daily low dose. He is able to tolerated. Liver function tests in June 2018 was normal. He has deferred as high dose flu shot today. He will have it next month. Since his last visit he was up in the mountains at [place], [place] and then ran out of oxygen and got very hypoxemic. Currently he is back to baseline. There are no new issues.    K-BILD ILD QUESTIONNAIRE, Symptom score over prior 2 weeks  7-none, 6-rarely, 5-occ, 5-some times, 3-sev times, 2-most times, 1-every time 11/24/2014  04/24/2016   Dyspnea for stairs, incline or hill 2 2  Chest Tightness 6 4  Worry about seriousness of lung complaint 5 5  Avoided doing things that make you dyspneic 5 6  Have you felt loss of control of lung condition (reversed from original) 6 6  Felt fed up due to lung condition 5 6  Felt urge to breathe aka air hunger 6 6  Has lung condition made you feel anxious 6 6  How often  have you experienced wheezing or whistling sound 6 5  How much of the time have you felt your lung  dz is getting worse 7 6  How much has your lung condition interfered with job or daily task 5 5  Were you expecting your lung condition to get worse 7 6  How much has your lung function limited you carrying things like groceris 6 5  How much has your lung function made you think of EOL? 6 6  Total 78 68  Are you financially worse off 6 6  Grand Total 84 72     Results for Darrell Ware, Darrell Ware (MRN [id number]) as of 07/25/2016 12:36  Ref. Range 05/11/2014 09:54 09/29/2014 11:51 09/12/2015 10:46 04/22/2016 09:30 07/25/2016 11:19  FVC-Pre Latest Units: L 3.76 3.80 3.49 3.44 3.37  FVC-%Pred-Pre Latest Units: % 87 88 82 81 80   Results for Darrell Ware, Darrell Ware (MRN [id number]) as of 07/25/2016 12:36  Ref. Range 05/11/2014 09:54 09/29/2014 11:51 09/12/2015 10:46 04/22/2016 09:30 07/25/2016 11:19  DLCO unc Latest Units: ml/min/mmHg 13.71 13.83 13.39  12.17  DLCO unc % pred Latest Units: % 37 38 36  33          has a past medical history of Atelectasis; Chronic bronchitis; Hemorrhoid; Inguinal hernia; Postural hypotension; and RBBB (2010).   reports that he quit smoking about 63 years ago. His smoking use included Pipe and Cigars. He has a 0.40 pack-year smoking history. He has never used smokeless tobacco.  Past Surgical History:  Procedure Laterality Date  . CATARACT EXTRACTION     bilat  . CORNEAL TRANSPLANT  1999   Right  . left arm fracture  1950  . RETINAL DETACHMENT SURGERY     Bilat  . TONSILLECTOMY  1935  . VASECTOMY  1972    Allergies  Allergen Reactions  . Peanut-Containing Drug Products     Cough,wheezing - GENERAL PEANUT ALLERGY  . Pirfenidone     Liver trouble  . Cialis [Tadalafil] Rash    Immunization History  Administered Date(s) Administered  . Influenza Split 11/25/2011, 01/02/2014  . Influenza, High Dose Seasonal PF 11/25/2015  . Influenza,inj,Quad PF,6+ Mos 11/24/2012,  11/25/2014  . Pneumococcal-Unspecified 04/24/2012    Family History  Problem Relation Age of Onset  . Heart failure Father   . Heart failure Mother   . Macular degeneration Brother   . Macular degeneration Sister      Current Outpatient Prescriptions:  .  AZOPT 1 % ophthalmic suspension, Place 1 drop into the right eye 3 (three) times daily. , Disp: , Rfl:  .  Beta Sitosterol 40 % POWD, 1 tablet by Does not apply route 2 (two) times daily., Disp: , Rfl:  .  Cyanocobalamin (B-12) 2500 MCG SUBL, Place under the tongue daily., Disp: , Rfl:  .  Flaxseed, Linseed, (FLAX SEED OIL PO), Take by mouth daily., Disp: , Rfl:  .  LOTEMAX 0.5 % GEL, Place 1 drop into the right eye 2 (two) times daily. , Disp: , Rfl:  .  Nintedanib (OFEV) 100 MG CAPS, Take 100 mg by mouth 2 (two) times daily., Disp: 60 capsule, Rfl: 6 .  NON FORMULARY, [place] supplement twice daily, Disp: , Rfl:  .  ZIOPTAN 0.0015 % SOLN, Place 1 drop into the right eye daily., Disp: , Rfl:     Review of Systems     Objective:   Physical Exam  Constitutional: He is oriented to person, place, and time. He appears well-developed and well-nourished. No distress.  HENT:  Head: Normocephalic and atraumatic.  Right Ear: External ear normal.  Left Ear: External ear  normal.  Mouth/Throat: Oropharynx is clear and moist. No oropharyngeal exudate.  Eyes: Pupils are equal, round, and reactive to light. Conjunctivae and EOM are normal. Right eye exhibits no discharge. Left eye exhibits no discharge. No scleral icterus.  Neck: Normal range of motion. Neck supple. No JVD present. No tracheal deviation present. No thyromegaly present.  Cardiovascular: Normal rate, regular rhythm and intact distal pulses.  Exam reveals no gallop and no friction rub.   No murmur heard. Pulmonary/Chest: Effort normal. No respiratory distress. He has no wheezes. He has rales. He exhibits no tenderness.  Abdominal: Soft. Bowel sounds are normal. He exhibits no  distension and no mass. There is no tenderness. There is no rebound and no guarding.  Musculoskeletal: Normal range of motion. He exhibits no edema or tenderness.  Lymphadenopathy:    He has no cervical adenopathy.  Neurological: He is alert and oriented to person, place, and time. He has normal reflexes. No cranial nerve deficit. Coordination normal.  Skin: Skin is warm and dry. No rash noted. He is not diaphoretic. No erythema. No pallor.  Psychiatric: He has a normal mood and affect. His behavior is normal. Judgment and thought content normal.  Nursing note and vitals reviewed.  Vitals:   11/04/16 1047  BP: 130/70  Pulse: 75  SpO2: 94%  Weight: 184 lb 6 oz (83.6 kg)  Height:  (1.854 m)    Estimated body mass index is 24.33 kg/m as calculated from the following:   Height as of this encounter:  (1.854 m).   Weight as of this encounter: 184 lb 6 oz (83.6 kg).     Assessment:       ICD-10-CM   1. Idiopathic pulmonary fibrosis (HCC) J84.112        Plan:     Idiopathic pulmonary fibrosis 3% decline in lung function in 1 year as of June 2018 but seems stable 11/04/2016 since June 2018  Plan Continue ofev lower dose  twice daily Use o2 with exertion Flu shot next month with PCP Stoneking, Hal, MD; recommend high dose  Followup In 3 months with Pre-bd spiro and dlco only. No lung volume or bd response. No post-bd spiro Return to see me in 3 months or sooner if needed  - LFT at followup  - KBILD at followup  [name], M.D., [facility].C.P Pulmonary and Critical Care Medicine Staff Physician [facility] Pager: [phone], If no answer or between  15:00h - 7:00h: call [phone]  11/04/2016 11:17 AM

## 2016-11-04 NOTE — Addendum Note (Signed)
Addended by: Sheran LuzEAST, Ladona Rosten K on: 11/04/2016 11:24 AM   Modules accepted: Orders

## 2016-11-28 ENCOUNTER — Telehealth: Payer: Self-pay | Admitting: Internal Medicine

## 2016-11-28 NOTE — Telephone Encounter (Signed)
So the cal comes at 10.46am asking for me to call back by noon: in this case I should have been told straight away by phone or face to face instead of a triage message via epic  .............................. Called dr Retta Mac office 11/28/2016 3:28 PM ans svc: lmtcb   [name], M.D., [facility].C.P Pulmonary and Critical Care Medicine Staff Physician, [facility] Center Director - Interstitial Lung Disease  Pulmonary Fibrosis [facility], [place], [address]  Pager: [phone], If no answer or between  15:00h - 7:00h: call [phone] Telephone: [phone]

## 2016-11-28 NOTE — Telephone Encounter (Signed)
MR this was not an urgent message, that is why it was not sent as a red message.  They just need the form completed and faxed back to their office so that they may set the pt up for the procedure.  They did not want to schedule the pt for the procedure without the form being completed by you and they needed to know if the pt needed to come off any meds before this procedure.  Thanks

## 2016-11-28 NOTE — Telephone Encounter (Signed)
Form has been placed in MR cubby to be reviewed.  They are needing a surgical clearance for the pt to be scheduled for an extraction procedure.  MR please advise. Thanks

## 2016-12-01 NOTE — Telephone Encounter (Signed)
Spoke to the dentist about holding ofev for 24h prior to surgery and 1 week after surgeyr  [name], M.D., [facility].C.P Pulmonary and Critical Care Medicine Staff Physician [facility] [place] Pulmonary and Critical Care Pager: [phone], If no answer or between  15:00h - 7:00h: call [phone]  12/01/2016 9:14 AM

## 2016-12-02 NOTE — Telephone Encounter (Signed)
Called pt letting him know information about holding OFEV medication 24hr before the procedure and then also 1 week after. Pt expressed understanding. Nothing further needed.

## 2016-12-26 IMAGING — CR DG CHEST 2V
2 series · 2 of 2 positions shown · non-contrast
Comparison: 08/30/2012 and 05/26/2011

CLINICAL DATA: Follow-up pulmonary fibrosis. Shortness breath is
getting worse.

EXAM:
CHEST  2 VIEW

[view not recorded (1 of 2)]
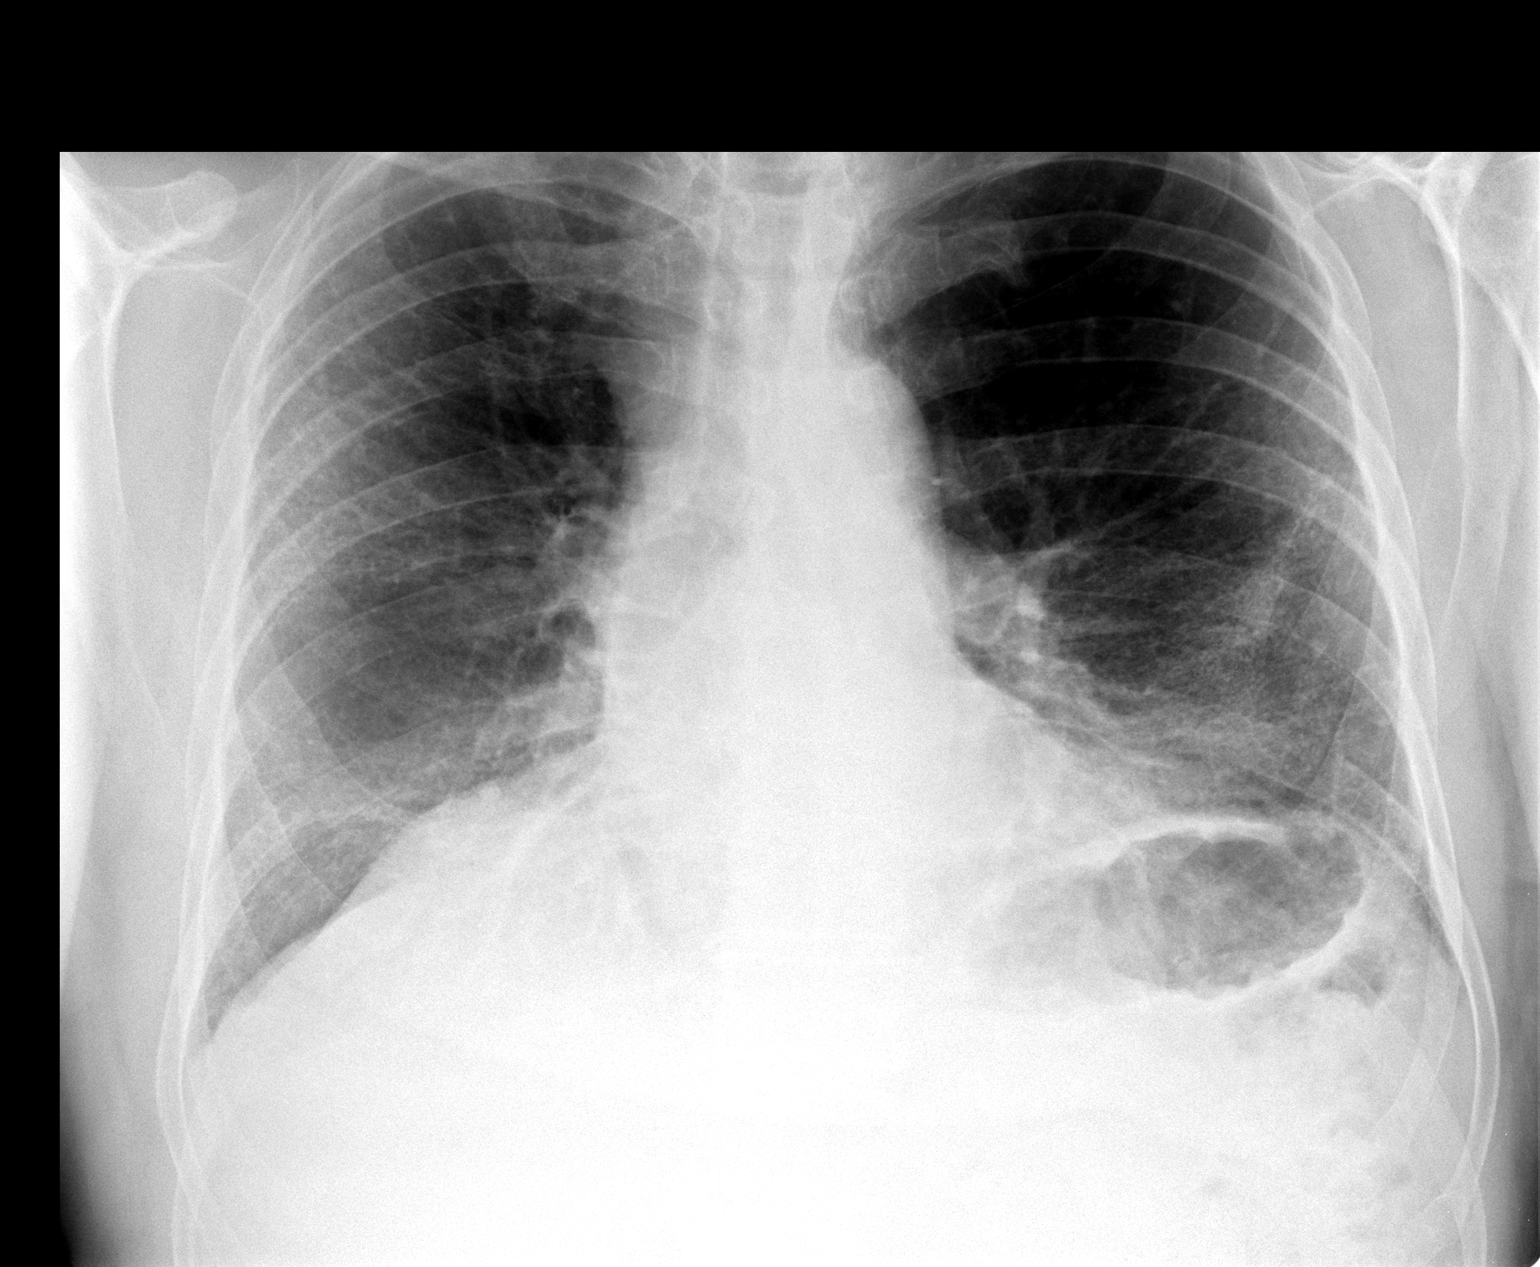

[view not recorded (2 of 2)]
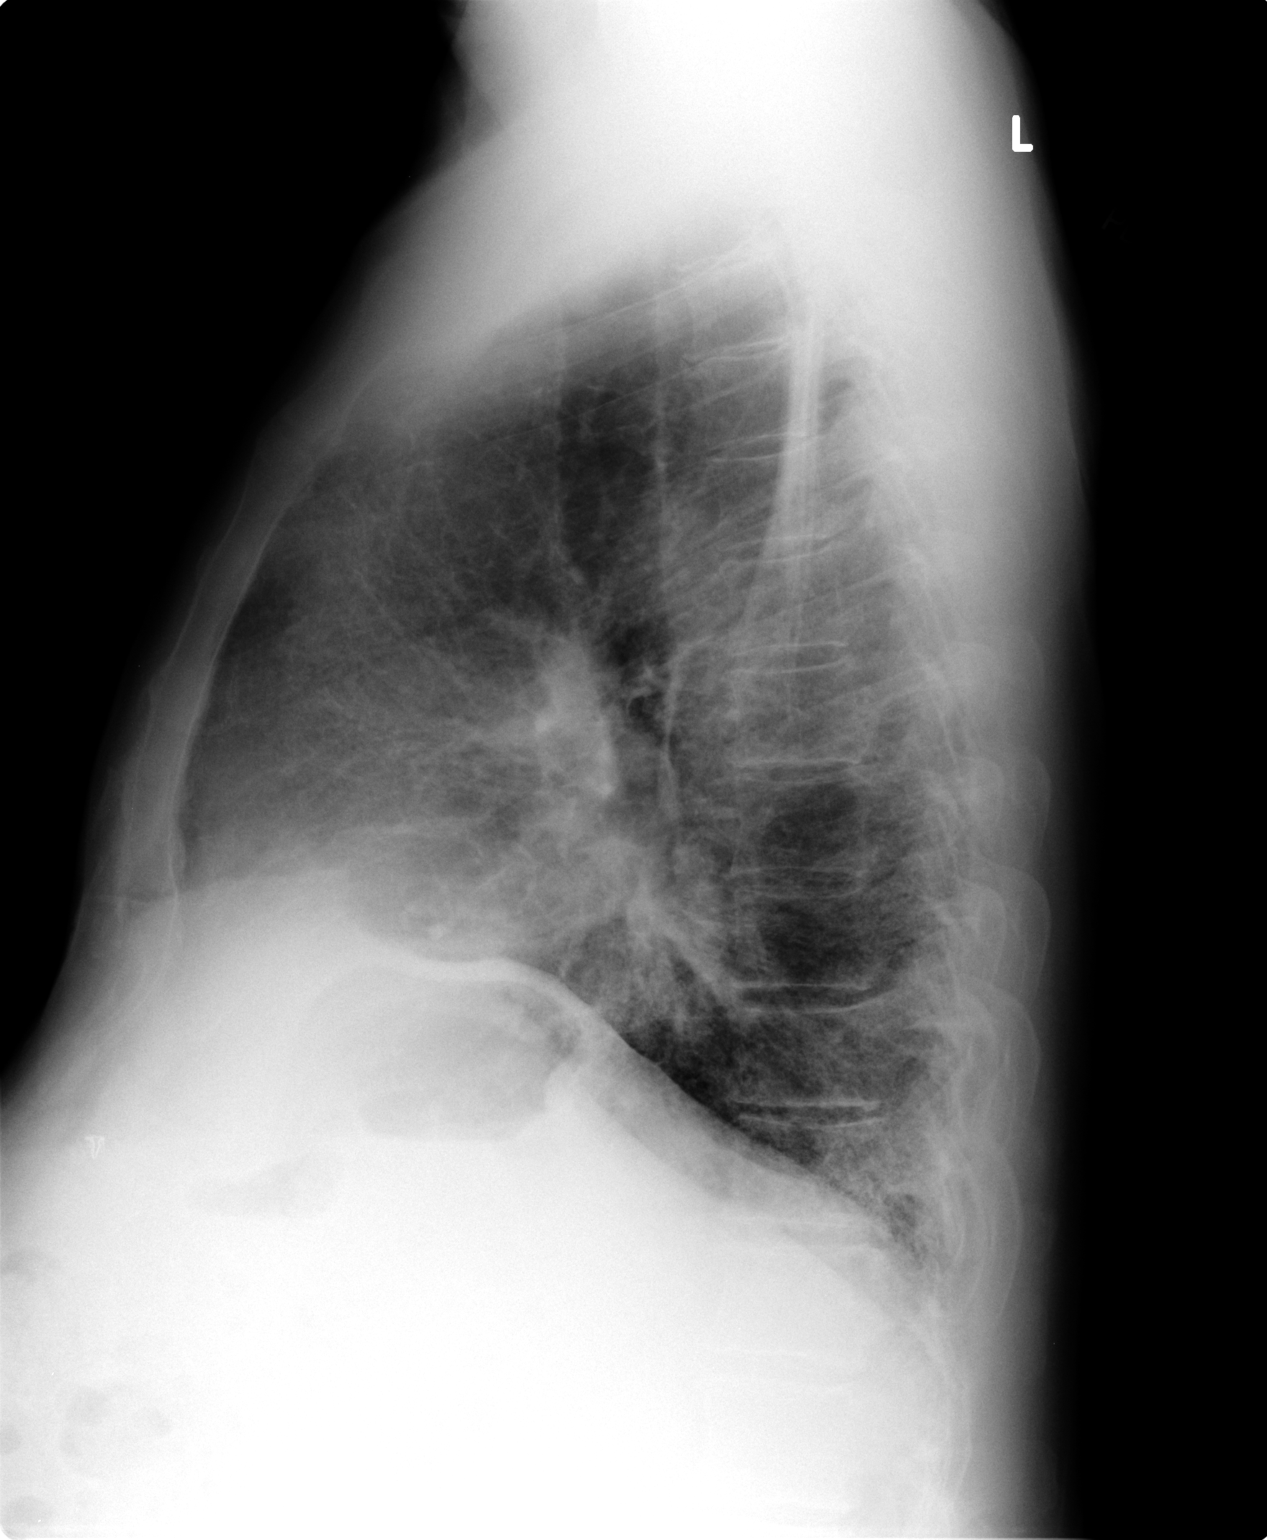

[2 of 2 positions shown; findings below may reference images not displayed]

FINDINGS: Again noted are prominent interstitial lung markings consistent with
interstitial lung disease and fibrosis. The distribution disease has
not significantly changed. Slightly increased interstitial densities
in the left lower lung. No large pleural effusions. No acute bone
abnormality. Heart and mediastinum are stable.
IMPRESSION: Prominent interstitial lung markings are compatible with
interstitial lung disease. There is concern for mild progression in
the left lower lung. No large areas of airspace disease or
consolidation.

## 2017-02-09 ENCOUNTER — Encounter: Payer: Self-pay | Admitting: Internal Medicine

## 2017-02-09 ENCOUNTER — Other Ambulatory Visit (INDEPENDENT_AMBULATORY_CARE_PROVIDER_SITE_OTHER): Payer: Medicare Other

## 2017-02-09 ENCOUNTER — Ambulatory Visit (INDEPENDENT_AMBULATORY_CARE_PROVIDER_SITE_OTHER): Payer: Medicare Other | Admitting: Internal Medicine

## 2017-02-09 ENCOUNTER — Ambulatory Visit: Payer: Medicare Other | Admitting: Internal Medicine

## 2017-02-09 VITALS — BP 122/64 | HR 67 | Ht 73.0 in | Wt 189.8 lb

## 2017-02-09 DIAGNOSIS — Z5181 Encounter for therapeutic drug level monitoring: Secondary | ICD-10-CM

## 2017-02-09 DIAGNOSIS — J84112 Idiopathic pulmonary fibrosis: Secondary | ICD-10-CM | POA: Diagnosis not present

## 2017-02-09 LAB — HEPATIC FUNCTION PANEL
ALBUMIN: 4.1 g/dL (ref 3.5–5.2)
ALK PHOS: 42 U/L (ref 39–117)
ALT: 22 U/L (ref 0–53)
AST: 18 U/L (ref 0–37)
Bilirubin, Direct: 0.1 mg/dL (ref 0.0–0.3)
TOTAL PROTEIN: 7.8 g/dL (ref 6.0–8.3)
Total Bilirubin: 0.5 mg/dL (ref 0.2–1.2)

## 2017-02-09 NOTE — Progress Notes (Signed)
Subjective:     Patient ID: Darrell BasquesDenver H Treichler, male   DOB: 15-Feb-1927, 83 y.o.   MRN: [id number]  HPI   OV 09/01/2014  Chief Complaint  Patient presents with  . Follow-up    Pt is a former [facility] pt. Pt states he was on OFEV but stoppped March 2016. Pt stated he rapidly declined since not being on the OFEV. When pt saw [facility] pt requested to be placed back on OFEV. Pt is currently on 150mg  BID. Pt c/o DOE, intermittent cough.    Follow-up idiopathic pulmonary fibrosis (diagnosis based on age, male sex, negative autoimmune profile and a CT scan of the chest April 2013]  This is a transfer of care. Patient is to be followed by Dr. Marcelyn BruinsKeith Clance and once by Dr. Clayton LefortV Sood for IPF. He is now following with me for Specialist IPF care. Presents with his wife both give history.  IPF diagnosed in spring of 2013. Then in the summer of 2014 through the expanded access program was on Pirfenidone but developed extremely high liver function anormlaity and had to be discontinued. Subsequently in the summer of 2015 was started on Nintedanib (Ofev) . He tolerated this well for 6 months. Then in the early part of 2016 started developing diarrhea with associated anorexia and weight loss. Symptoms are progressive. Symptoms ultimately became severe o he had abruptly discontinue the drug. Subsequently within a few days he started feeling dramatically better and all side effects resolved. However since that time he is having progressive dyspnea. He feels his interstitial lung disease is worse. A year ago overnight oxygen study and exertional pulse ox while he exercises on a recumbent bike at home showed no desaturation. However now for the last few months he is noticing desaturation into the 85% when he exercises. Correspondingly 2 days ago at our pulmonary office when he exerted he did desaturate to below 88%. He knows wants to restart his Ninetedanib ; wife wants to start him at low dose. He is currently taking old supply at the full  dose at 150 mg twice daily. He is open to attending pulmonary rehabilitation because his home exercise program has trailed off given his worsening symptoms. He is active in the pulmonary fibrosis foundation support group  His pulmonary function test within July 2015 when he was started on Ninntedanib and through March 2016 when he came off the drug have remained stable with moderate disease severity only. His FVC is 3.76 L/87%, total lung capacity is 5.12 L/66% and DLCO is 13.7/37%.    09/29/2014 Follow up : IPF and Chronic Hypoxic Resp Failure  Patient returns for a 4 week follow-up Patient was seen last visit with change of his pulmonary fibrosis medicine, OFEV , to lower dose to help with potential GI side effects. He is currently on 100 mg twice daily. He says he is tolerating well. Does have some mild nausea, but is tolerable. We discussed getting lab work today with LFTs. Patient was also started on oxygen with activity. He says this is really helped with his exercise regimen. He is waiting on his portable concentrator to be delivered. Order was checked on through his DME company today and has been approved. He has been referred to pulmonary rehabilitation, he is currently waiting for call for initiation of program. PFTs were repeated today that are similar to 2013. FVC 88%, FEV100%, ratio 80, diffusing capacity 38%. Patient says overall he feels that he is improved. He does get fatigued but feels that  this is slightly improved. He tries to exercise daily. He travels quite a bit with his wife. Is planning a trip to [place] in February.    OV 11/24/2014   Chief Complaint  Patient presents with  . Follow-up    pt. states breathing is baseline. SOB at baseline. dry cough is baseline. denies chest pain/tightness.   Follow-up idiopathic pulmonary fibrosis - on low dose ofev due to gi side effects   He presents with his wife. He is tolerating the low-dose of Ofev quite well. Does not have  any GI side effects. Overall he and his wife attest that his pulmonary health is stable. Looking at the interstitial lung disease questionnaire below it is obvious that he has significant dyspnea when he climbs stairs or hill but otherwise is a high performing individual. He is planning a trip in [place] in January this is an annual winter trip where he goes for 2 weeks. Otherwise no new problems. He was attending the pulmonary fibrosis foundation support group yesterday. He uses oxygen with exertion   Last the function test August 2016 was normal this is for monitoring on Ofev which is needed every 3 months  OV 02/14/2015  Chief Complaint  Patient presents with  . Follow-up    Pt states his breathing is unchanged since last OV. Pt states he feels a slight decline in breathing since one year ago - DOE. Pt c/o dry cough and intermittent chest discomfort.    Follow-up idiopathic pulmonary fibrosis-on nintedanib since summer 2015 and on low-dose since July 2016 seen due to GI side effects [hepatotoxicity with Esbriet 2014]  He has completed pulmonary rehabilitation. He says he learned a lot. His 6 minute walk distance improved with rehabilitation. He feels stable. However the rehabilitation specialist several options oxygen with exertion to 4 L. He does not think this is due to her decline. He thinks this is more due to his baseline requirement. Liver function test yesterday was normal. Spirometry today is FVC 3.5 L/72% and it is a decline since August 2016 PFT but as is often the case in our practice the PFT and spirometry is do not fully correlate. So I will taken at face value that he is stable. He is due to go to [place] in January 2017 and he wanted me to fill out paperwork. He wants to use the oxygen in flight and during landing.   OV 09/12/2015  Chief Complaint  Patient presents with  . Follow-up    review pft.  pt states his breathing is unchanged- SOB with exertion.    Follow-up idiopathic  pulmonary fibrosis: is-on nintedanib since summer 2015 and on low-dose since July 2016 seen due to GI side effects [hepatotoxicity with Esbriet 2014]  Last seen December 2016. After that he went 5 weeks to [place]. He and his wife enjoyed themselves a lot. This is an annual condition for them. Overall he is reporting stability in the last 6 months. However compared to a year or 2 ago he feels the dz  slowly gradually progressive. In fact on forced vital capacity he's had 8% progression in that in 2 years and 5.7% progression in 1 year with 0% progression in 6 months. His FVC and July 2015 was 3.89 he did in August 2016 it was 3.8. In December 2016 was 3.5 and currently it is 3.58. There is also some progression and DLCO over time. He is fully active. Last liver function test was December 2016.    OV 04/24/2016  Chief Complaint  Patient presents with  . Follow-up    Pt states he got the flu in mid Jan 2018 and states he does not feel back to baseline. Pt c/o cough but states this is due to eating peanuts the other day. Pt denies CP/tightness and f/c/s.    Follow-up idiopathic pulmonary fibrosis on low-dose Ofev with prior history of hepatotoxicity with Pirfenidone (Esbriet)  Mid-January 2018 he had influenza and was treated outpatient basis with prednisone and Tamiflu. Since then he slowly recovered and is almost at his baseline. He did go to his annual [place] trip. He did use oxygen on flight. He uses oxygen on with exertion and when and air borne. - at end dropped to 87%; few days ago. In terms of his symptoms compared to last summer he feels overall stable except for the interim influenza. However in comparing the interstitial lung disease questionnaire from one half years ago scoring shows slight decline. Pulmicort function tests also show slight decline over time but only 1% variation decline compared to 6 months ago.   OV 07/25/2016  Chief Complaint  Patient presents with  . Follow-up     review pft.  pt states he is doing well, notes low bp.     Follow-up idiopathic pulmonary fibrosis (diagnosis based on age, male sex, negative autoimmune profile and a CT scan of the chest April   For folllwup. Overall stable but mothers day 2018 got exposed to huge pollen load while helping son with car dent. Then had low bp, cough and wheeze and fatigute but now slowly getting better but stil with occ wheeze. Tolerating ofev lower dose just fine. Overall compared to a year ago he feels he has progressed somewhat. FVC decline in 1 year is 3%. Due to hx of wheeze we did feno and is 30s ppb and elevated     OV 11/04/2016  Chief Complaint  Patient presents with  . Follow-up    Pt complains of no symptoms other than [place] Pt has non productive dry cough.     idiopathic pulmonary fibrosis follow-up. He is on low dose Ofev. Diagnosis greater than 5 years ago. He said 3% decline in lung function as of March/June 2018. Last visit was in June 2018. He continues on his Ofev 100 mg twice daily low dose. He is able to tolerated. Liver function tests in June 2018 was normal. He has deferred as high dose flu shot today. He will have it next month. Since his last visit he was up in the mountains at [place], [place] and then ran out of oxygen and got very hypoxemic. Currently he is back to baseline. There are no new issues.  OV 02/09/2017  Chief Complaint  Patient presents with  . Follow-up    PFT done today. States that he has been doing good. Has occ. SOB with exertion and a cough that comes and goes. Denies any CP.    Misalignment presents for follow-up. Overall he is on low-dose tablet. Diagnosis will be 6 years in March 2019. He tells me that he feels a slow steady progression of the disease over the course of the last 1 year or so. Pulmonary function test shows a 7% decline in [employer] and he believes that he has decline by that extent. [facility] interstitial  lung disease questionnaire shows worsening of score because this course are getting lower. He feels he can benefit from a handicap placard and  also applying for [employer] priority list for electricity supply during power outage. He says he holds his breath and this helps him. We felt that maybe an incentive spirometer would help him. He is tolerating his low-dose of an impediment just fine. KBILD stable   K-BILD ILD QUESTIONNAIRE, Symptom score over prior 2 weeks  7-none, 6-rarely, 5-occ, 5-some times, 3-sev times, 2-most times, 1-every time 11/24/2014  04/24/2016  02/09/2017   Dyspnea for stairs, incline or hill 2 2 3   Chest Tightness 6 4 4   Worry about seriousness of lung complaint 5 5 5   Avoided doing things that make you dyspneic 5 6 5   Have you felt loss of control of lung condition (reversed from original) 6 6 6   Felt fed up due to lung condition 5 6 5   Felt urge to breathe aka air hunger 6 6 6   Has lung condition made you feel anxious 6 6 6   How often have you experienced wheezing or whistling sound 6 5 4   How much of the time have you felt your lung dz is getting worse 7 6 5   How much has your lung condition interfered with job or daily task 5 5 6   Were you expecting your lung condition to get worse 7 6 6   How much has your lung function limited you carrying things like groceris 6 5 5   How much has your lung function made you think of EOL? 6 6 5   Total 78 68 71  Are you financially worse off 6 6 4   Grand Total 84 72 75     Results for Darrell BasquesLENNON, Keagon H (MRN [id number]) as of 07/25/2016 12:36  Ref. Range 05/11/2014 09:54 09/29/2014 11:51 09/12/2015 10:46 04/22/2016 09:30 07/25/2016 11:19 02/09/2017   FVC-Pre Latest Units: L 3.76 3.80 3.49 3.44 3.37 3.25  FVC-%Pred-Pre Latest Units: % 87 88 82 81 80 77%   Results for Darrell BasquesLENNON, Brook H (MRN [id number]) as of 07/25/2016 12:36  Ref. Range 05/11/2014 09:54 09/29/2014 11:51 09/12/2015 10:46 04/22/2016 09:30 07/25/2016 11:19 02/09/2017   DLCO unc  Latest Units: ml/min/mmHg 13.71 13.83 13.39  12.17 10.98  DLCO unc % pred Latest Units: % 37 38 36  33 30       has a past medical history of Atelectasis, Chronic bronchitis, Hemorrhoid, Inguinal hernia, Postural hypotension, and RBBB (2010).   reports that he quit smoking about 64 years ago. His smoking use included pipe and cigars. He has a 0.40 pack-year smoking history. he has never used smokeless tobacco.  Past Surgical History:  Procedure Laterality Date  . CATARACT EXTRACTION     bilat  . CORNEAL TRANSPLANT  1999   Right  . left arm fracture  1950  . RETINAL DETACHMENT SURGERY     Bilat  . TONSILLECTOMY  1935  . VASECTOMY  1972    Allergies  Allergen Reactions  . Peanut-Containing Drug Products     Cough,wheezing - GENERAL PEANUT ALLERGY  . Pirfenidone     Liver trouble  . Cialis [Tadalafil] Rash    Immunization History  Administered Date(s) Administered  . Influenza Split 11/25/2011, 01/02/2014  . Influenza, High Dose Seasonal PF 11/25/2015, 01/09/2017  . Influenza,inj,Quad PF,6+ Mos 11/24/2012, 11/25/2014  . Pneumococcal-Unspecified 04/24/2012    Family History  Problem Relation Age of Onset  . Heart failure Father   . Heart failure Mother   . Macular degeneration Brother   . Macular degeneration Sister      Current Outpatient Medications:  .  AZOPT 1 % ophthalmic suspension, Place 1 drop into the right eye 3 (three) times daily. , Disp: , Rfl:  .  Beta Sitosterol 40 % POWD, 1 tablet by Does not apply route 2 (two) times daily., Disp: , Rfl:  .  Cyanocobalamin (B-12) 2500 MCG SUBL, Place under the tongue daily., Disp: , Rfl:  .  Flaxseed, Linseed, (FLAX SEED OIL PO), Take by mouth daily., Disp: , Rfl:  .  fluticasone (FLONASE) 50 MCG/ACT nasal spray, Place into the nose., Disp: , Rfl:  .  LOTEMAX 0.5 % GEL, Place 1 drop into the right eye 2 (two) times daily. , Disp: , Rfl:  .  Nintedanib (OFEV) 100 MG CAPS, Take 100 mg by mouth 2 (two) times daily.,  Disp: 60 capsule, Rfl: 6 .  NON FORMULARY, [place]lee supplement twice daily, Disp: , Rfl:  .  tamsulosin (FLOMAX) 0.4 MG CAPS capsule, Take 0.4 mg by mouth daily., Disp: , Rfl:  .  ZIOPTAN 0.0015 % SOLN, Place 1 drop into the right eye daily., Disp: , Rfl:    Review of Systems     Objective:   Physical Exam Vitals:   02/09/17 1143  BP: 122/64  Pulse: 67  SpO2: 97%  Weight: 189 lb 12.8 oz (86.1 kg)  Height: 6\' 1"  (1.854 m)      Estimated body mass index is 25.04 kg/m as calculated from the following:   Height as of this encounter: 6\' 1"  (1.854 m).   Weight as of this encounter: 189 lb 12.8 oz (86.1 kg).     Assessment:       ICD-10-CM   1. Idiopathic pulmonary fibrosis (HCC) J84.112   2. Therapeutic drug monitoring Z51.81        Plan:     7% slow decline in pulmonary function test in 1 year  Plan Check liver function test 02/09/2017 Continue Ofev Continue oxygen at night and exertion Apply for handicap sticker for car Use incentive spirometer - take prescription I think this helps you Apply to [employer] for high priority for energy supply to your house  followup  3-6 months do Pre-bd spiro and dlco only. No lung volume or bd response. No post-bd spiro Return to see Dr Marchelle Gearing in 3-6 months or soner if needed   > 50% of this > 25 min visit spent in face to face counseling or coordination of care    [name], M.D., [facility].C.P Pulmonary and Critical Care Medicine Staff Physician, [facility] Center Director - Interstitial Lung Disease  Program  Pulmonary Fibrosis [facility] at [facility] [place], [place], [address]  Pager: [phone], If no answer or between  15:00h - 7:00h: call [phone] Telephone: [phone]

## 2017-02-09 NOTE — Patient Instructions (Signed)
ICD-10-CM   1. Idiopathic pulmonary fibrosis (HCC) J84.112   2. Therapeutic drug monitoring Z51.81    7% slow decline in pulmonary function test in 1 year  Plan Check liver function test 02/09/2017 Continue Ofev Continue oxygen at night and exertion Apply for handicap sticker for car Use incentive spirometer - take prescription I think this helps you Apply to [employer] for high priority for energy supply to your house  followup  3-6 months do Pre-bd spiro and dlco only. No lung volume or bd response. No post-bd spiro Return to see Dr Marchelle Gearingamaswamy in 3-6 months or soner if needed

## 2017-02-09 NOTE — Progress Notes (Signed)
PFT completed today 02/09/17 Pre and DLCO only

## 2017-02-12 ENCOUNTER — Telehealth: Payer: Self-pay | Admitting: Internal Medicine

## 2017-02-12 NOTE — Telephone Encounter (Signed)
Called and spoke to pt letting him know that the handicap plackard form was ready for him to come pick up.  Told pt I was going to place it up front for him to come pick up. Pt expressed understanding. Nothing further needed.

## 2017-03-23 ENCOUNTER — Telehealth: Payer: Self-pay | Admitting: Internal Medicine

## 2017-03-23 NOTE — Telephone Encounter (Signed)
Will route to Emily for follow up 

## 2017-03-25 NOTE — Telephone Encounter (Signed)
Have received the forms from pt. Will give to MR for him to sign when he returns back to office on Monday, 03/30/17.

## 2017-03-30 NOTE — Telephone Encounter (Signed)
Left voice mail on machine for patient to return phone call back regarding [employer] paperwork is ready for p/u. X1 Per Irving BurtonEmily she is faxing the completed and signed forms to [employer] today for review Nothing further needed

## 2017-08-18 ENCOUNTER — Ambulatory Visit (INDEPENDENT_AMBULATORY_CARE_PROVIDER_SITE_OTHER): Payer: Medicare Other | Admitting: Internal Medicine

## 2017-08-18 DIAGNOSIS — J84112 Idiopathic pulmonary fibrosis: Secondary | ICD-10-CM | POA: Diagnosis not present

## 2017-08-18 LAB — PULMONARY FUNCTION TEST
DL/VA % PRED: 48 %
DL/VA: 2.28 ml/min/mmHg/L
DLCO unc % pred: 28 %
DLCO unc: 10.43 ml/min/mmHg
FEF 25-75 Pre: 2.1 L/sec
FEF2575-%Pred-Pre: 114 %
FEV1-%PRED-PRE: 83 %
FEV1-Pre: 2.43 L
FEV1FVC-%Pred-Pre: 112 %
FEV6-%Pred-Pre: 79 %
FEV6-PRE: 3.07 L
FEV6FVC-%Pred-Pre: 107 %
FVC-%PRED-PRE: 74 %
FVC-PRE: 3.08 L
Pre FEV1/FVC ratio: 79 %
Pre FEV6/FVC Ratio: 100 %

## 2017-08-18 NOTE — Progress Notes (Signed)
PFT completed today.  

## 2017-08-21 ENCOUNTER — Other Ambulatory Visit (INDEPENDENT_AMBULATORY_CARE_PROVIDER_SITE_OTHER): Payer: Medicare Other

## 2017-08-21 ENCOUNTER — Encounter: Payer: Self-pay | Admitting: Internal Medicine

## 2017-08-21 ENCOUNTER — Ambulatory Visit: Payer: Medicare Other | Admitting: Internal Medicine

## 2017-08-21 VITALS — BP 120/64 | HR 67 | Ht 73.0 in | Wt 186.4 lb

## 2017-08-21 DIAGNOSIS — Z5181 Encounter for therapeutic drug level monitoring: Secondary | ICD-10-CM | POA: Diagnosis not present

## 2017-08-21 DIAGNOSIS — J84112 Idiopathic pulmonary fibrosis: Secondary | ICD-10-CM

## 2017-08-21 LAB — HEPATIC FUNCTION PANEL
ALBUMIN: 4.3 g/dL (ref 3.5–5.2)
ALT: 23 U/L (ref 0–53)
AST: 18 U/L (ref 0–37)
Alkaline Phosphatase: 48 U/L (ref 39–117)
BILIRUBIN DIRECT: 0.1 mg/dL (ref 0.0–0.3)
Total Bilirubin: 0.3 mg/dL (ref 0.2–1.2)
Total Protein: 7.8 g/dL (ref 6.0–8.3)

## 2017-08-21 NOTE — Progress Notes (Signed)
Subjective:     Patient ID: Darrell Ware, male   DOB: 1926/04/18, 84 y.o.   MRN: [id number]  HPI  OV 09/01/2014  Chief Complaint  Patient presents with  . Follow-up    Pt is a former [facility] pt. Pt states he was on OFEV but stoppped March 2016. Pt stated he rapidly declined since not being on the OFEV. When pt saw [facility] pt requested to be placed back on OFEV. Pt is currently on 150mg  BID. Pt c/o DOE, intermittent cough.    Follow-up idiopathic pulmonary fibrosis (diagnosis based on age, male sex, negative autoimmune profile and a CT scan of the chest April 2013]  This is a transfer of care. Patient is to be followed by Dr. Marcelyn Bruins and once by Dr. Clayton Lefort for IPF. He is now following with me for Specialist IPF care. Presents with his wife both give history.  IPF diagnosed in spring of 2013. Then in the summer of 2014 through the expanded access program was on Pirfenidone but developed extremely high liver function anormlaity and had to be discontinued. Subsequently in the summer of 2015 was started on Nintedanib (Ofev) . He tolerated this well for 6 months. Then in the early part of 2016 started developing diarrhea with associated anorexia and weight loss. Symptoms are progressive. Symptoms ultimately became severe o he had abruptly discontinue the drug. Subsequently within a few days he started feeling dramatically better and all side effects resolved. However since that time he is having progressive dyspnea. He feels his interstitial lung disease is worse. A year ago overnight oxygen study and exertional pulse ox while he exercises on a recumbent bike at home showed no desaturation. However now for the last few months he is noticing desaturation into the 85% when he exercises. Correspondingly 2 days ago at our pulmonary office when he exerted he did desaturate to below 88%. He knows wants to restart his Ninetedanib ; wife wants to start him at low dose. He is currently taking old supply at the full  dose at 150 mg twice daily. He is open to attending pulmonary rehabilitation because his home exercise program has trailed off given his worsening symptoms. He is active in the pulmonary fibrosis foundation support group  His pulmonary function test within July 2015 when he was started on Ninntedanib and through March 2016 when he came off the drug have remained stable with moderate disease severity only. His FVC is 3.76 L/87%, total lung capacity is 5.12 L/66% and DLCO is 13.7/37%.    09/29/2014 Follow up : IPF and Chronic Hypoxic Resp Failure  Patient returns for a 4 week follow-up Patient was seen last visit with change of his pulmonary fibrosis medicine, OFEV , to lower dose to help with potential GI side effects. He is currently on 100 mg twice daily. He says he is tolerating well. Does have some mild nausea, but is tolerable. We discussed getting lab work today with LFTs. Patient was also started on oxygen with activity. He says this is really helped with his exercise regimen. He is waiting on his portable concentrator to be delivered. Order was checked on through his DME company today and has been approved. He has been referred to pulmonary rehabilitation, he is currently waiting for call for initiation of program. PFTs were repeated today that are similar to 2013. FVC 88%, FEV100%, ratio 80, diffusing capacity 38%. Patient says overall he feels that he is improved. He does get fatigued but feels that this  is slightly improved. He tries to exercise daily. He travels quite a bit with his wife. Is planning a trip to [place] in February.    OV 11/24/2014   Chief Complaint  Patient presents with  . Follow-up    pt. states breathing is baseline. SOB at baseline. dry cough is baseline. denies chest pain/tightness.   Follow-up idiopathic pulmonary fibrosis - on low dose ofev due to gi side effects   He presents with his wife. He is tolerating the low-dose of Ofev quite well. Does not have  any GI side effects. Overall he and his wife attest that his pulmonary health is stable. Looking at the interstitial lung disease questionnaire below it is obvious that he has significant dyspnea when he climbs stairs or hill but otherwise is a high performing individual. He is planning a trip in [place] in January this is an annual winter trip where he goes for 2 weeks. Otherwise no new problems. He was attending the pulmonary fibrosis foundation support group yesterday. He uses oxygen with exertion   Last the function test August 2016 was normal this is for monitoring on Ofev which is needed every 3 months  OV 02/14/2015  Chief Complaint  Patient presents with  . Follow-up    Pt states his breathing is unchanged since last OV. Pt states he feels a slight decline in breathing since one year ago - DOE. Pt c/o dry cough and intermittent chest discomfort.    Follow-up idiopathic pulmonary fibrosis-on nintedanib since summer 2015 and on low-dose since July 2016 seen due to GI side effects [hepatotoxicity with Esbriet 2014]  He has completed pulmonary rehabilitation. He says he learned a lot. His 6 minute walk distance improved with rehabilitation. He feels stable. However the rehabilitation specialist several options oxygen with exertion to 4 L. He does not think this is due to her decline. He thinks this is more due to his baseline requirement. Liver function test yesterday was normal. Spirometry today is FVC 3.5 L/72% and it is a decline since August 2016 PFT but as is often the case in our practice the PFT and spirometry is do not fully correlate. So I will taken at face value that he is stable. He is due to go to [place] in January 2017 and he wanted me to fill out paperwork. He wants to use the oxygen in flight and during landing.   OV 09/12/2015  Chief Complaint  Patient presents with  . Follow-up    review pft.  pt states his breathing is unchanged- SOB with exertion.    Follow-up idiopathic  pulmonary fibrosis: is-on nintedanib since summer 2015 and on low-dose since July 2016 seen due to GI side effects [hepatotoxicity with Esbriet 2014]  Last seen December 2016. After that he went 5 weeks to [place]. He and his wife enjoyed themselves a lot. This is an annual condition for them. Overall he is reporting stability in the last 6 months. However compared to a year or 2 ago he feels the dz  slowly gradually progressive. In fact on forced vital capacity he's had 8% progression in that in 2 years and 5.7% progression in 1 year with 0% progression in 6 months. His FVC and July 2015 was 3.89 he did in August 2016 it was 3.8. In December 2016 was 3.5 and currently it is 3.58. There is also some progression and DLCO over time. He is fully active. Last liver function test was December 2016.    OV 04/24/2016  Chief Complaint  Patient presents with  . Follow-up    Pt states he got the flu in mid Jan 2018 and states he does not feel back to baseline. Pt c/o cough but states this is due to eating peanuts the other day. Pt denies CP/tightness and f/c/s.    Follow-up idiopathic pulmonary fibrosis on low-dose Ofev with prior history of hepatotoxicity with Pirfenidone (Esbriet)  Mid-January 2018 he had influenza and was treated outpatient basis with prednisone and Tamiflu. Since then he slowly recovered and is almost at his baseline. He did go to his annual [place] trip. He did use oxygen on flight. He uses oxygen on with exertion and when and air borne. - at end dropped to 87%; few days ago. In terms of his symptoms compared to last summer he feels overall stable except for the interim influenza. However in comparing the interstitial lung disease questionnaire from one half years ago scoring shows slight decline. Pulmicort function tests also show slight decline over time but only 1% variation decline compared to 6 months ago.   OV 07/25/2016  Chief Complaint  Patient presents with  . Follow-up     review pft.  pt states he is doing well, notes low bp.     Follow-up idiopathic pulmonary fibrosis (diagnosis based on age, male sex, negative autoimmune profile and a CT scan of the chest April   For folllwup. Overall stable but mothers day 2018 got exposed to huge pollen load while helping son with car dent. Then had low bp, cough and wheeze and fatigute but now slowly getting better but stil with occ wheeze. Tolerating ofev lower dose just fine. Overall compared to a year ago he feels he has progressed somewhat. FVC decline in 1 year is 3%. Due to hx of wheeze we did feno and is 30s ppb and elevated     OV 11/04/2016  Chief Complaint  Patient presents with  . Follow-up    Pt complains of no symptoms other than SOB [place]. Pt has non productive dry cough.     idiopathic pulmonary fibrosis follow-up. He is on low dose Ofev. Diagnosis greater than 5 years ago. He said 3% decline in lung function as of March/June 2018. Last visit was in June 2018. He continues on his Ofev 100 mg twice daily low dose. He is able to tolerated. Liver function tests in June 2018 was normal. He has deferred as high dose flu shot today. He will have it next month. Since his last visit he was up in the mountains at [place], [place] and then ran out of oxygen and got very hypoxemic. Currently he is back to baseline. There are no new issues.  OV 02/09/2017  Chief Complaint  Patient presents with  . Follow-up    PFT done today. States that he has been doing good. Has occ. SOB with exertion and a cough that comes and goes. Denies any CP.     presents for follow-up. Overall he is on low-dose tablet. Diagnosis will be 6 years in March 2019. He tells me that he feels a slow steady progression of the disease over the course of the last 1 year or so. Pulmonary function test shows a 7% decline in [facility] and he believes that he has decline by that extent. [facility] interstitial lung disease  questionnaire shows worsening of score because this course are getting lower. He feels he can benefit from a handicap placard and  also applying for [employer] priority list for electricity supply during power outage. He says he holds his breath and this helps him. We felt that maybe an incentive spirometer would help him. He is tolerating his low-dose of an impediment just fine. KBILD stable   OV 08/21/2017  Chief Complaint  Patient presents with  . Follow-up    Pt states he has been doing well since last visit. Pt thinks IPF is gradually progressing slowly. PFT was performed 6/25. Pt does wear 4-5L with exertion and also wears 2L O2 at night   [address] , 84 y.o. , with dob 11-Jul-1926 and male ,Not Hispanic or Latino from [address] [place] [address] - presents to lung clinic for ipf followup. Presents with his wife as always. He tells me that he's been on ofev for 3 or 4 years. Initially was in the high dosebut then developed GI discomfort and then reduce to the lower dose of 100 mg twice daily which he has continued without much of a problem except for on and off occasional nonspecific abdominal pain that is mild and transient. He said serial liver function with [place] that always been normal so far. He tells me in the last 6 months since I saw him he continues to have progressive worsening of shortness of breath. He needs 4 L with exertion but nothing at rest. He continues daily exercise and maintains significant activity at home. We discussed the possibility of going back up on the full dose given the decline. He is thinking about it. Your lung function test today and his FVC has declined further 3% compared to last visit. The entire profile as listed below      K-BILD ILD QUESTIONNAIRE, Symptom score over prior 2 weeks  7-none, 6-rarely, 5-occ, 5-some times, 3-sev times, 2-most times, 1-every time 11/24/2014  04/24/2016  02/09/2017   Dyspnea for stairs, incline or hill 2 2 3    Chest Tightness 6 4 4   Worry about seriousness of lung complaint 5 5 5   Avoided doing things that make you dyspneic 5 6 5   Have you felt loss of control of lung condition (reversed from original) 6 6 6   Felt fed up due to lung condition 5 6 5   Felt urge to breathe aka air hunger 6 6 6   Has lung condition made you feel anxious 6 6 6   How often have you experienced wheezing or whistling sound 6 5 4   How much of the time have you felt your lung dz is getting worse 7 6 5   How much has your lung condition interfered with job or daily task 5 5 6   Were you expecting your lung condition to get worse 7 6 6   How much has your lung function limited you carrying things like groceris 6 5 5   How much has your lung function made you think of EOL? 6 6 5   Total 78 68 71  Are you financially worse off 6 6 4   Grand Total 84 72 75    Results for Darrell Ware, Darrell Ware (MRN [id number]) as of 08/21/2017 15:50  Ref. Range 08/30/2013 09:56 05/11/2014 09:54 09/29/2014 11:51 09/12/2015 10:46 04/22/2016 09:30 07/25/2016 11:19 02/09/2017 10:57 08/18/2017 15:57  FVC-Pre Latest Units: L 3.89 3.76 3.80 3.49 3.44 3.37 3.25 3.08  FVC-%Pred-Pre Latest Units: % 89 87 88 82 81 80 77 74    Results for Darrell Ware, Darrell Ware (MRN [id number]) as of 08/21/2017 15:50  Ref. Range 08/30/2013 09:56 05/11/2014  09:54 09/29/2014 11:51 09/12/2015 10:46 04/22/2016 09:30 07/25/2016 11:19 02/09/2017 10:57 08/18/2017 15:57  DLCO unc Latest Units: ml/min/mmHg 13.51 13.71 13.83 13.39  12.17 10.98 10.43  DLCO unc % pred Latest Units: % 37 37 38 36  33 30 28       has a past medical history of Atelectasis, Chronic bronchitis, Hemorrhoid, Inguinal hernia, Postural hypotension, and RBBB (2010).   reports that he quit smoking about 64 years ago. His smoking use included pipe and cigars. He has a 0.40 pack-year smoking history. He has never used smokeless tobacco.  Past Surgical History:  Procedure Laterality Date  . CATARACT EXTRACTION     bilat  . CORNEAL TRANSPLANT   1999   Right  . left arm fracture  1950  . RETINAL DETACHMENT SURGERY     Bilat  . TONSILLECTOMY  1935  . VASECTOMY  1972    Allergies  Allergen Reactions  . Peanut-Containing Drug Products     Cough,wheezing - GENERAL PEANUT ALLERGY  . Pirfenidone     Liver trouble  . Cialis [Tadalafil] Rash    Immunization History  Administered Date(s) Administered  . Influenza Split 11/25/2011, 01/02/2014  . Influenza, High Dose Seasonal PF 11/25/2015, 01/09/2017  . Influenza,inj,Quad PF,6+ Mos 11/24/2012, 11/25/2014  . Pneumococcal-Unspecified 04/24/2012    Family History  Problem Relation Age of Onset  . Heart failure Father   . Heart failure Mother   . Macular degeneration Brother   . Macular degeneration Sister      Current Outpatient Medications:  .  AZOPT 1 % ophthalmic suspension, Place 1 drop into the right eye 3 (three) times daily. , Disp: , Rfl:  .  Beta Sitosterol 40 % POWD, 1 tablet by Does not apply route 2 (two) times daily., Disp: , Rfl:  .  Cyanocobalamin (B-12) 2500 MCG SUBL, Place under the tongue daily., Disp: , Rfl:  .  Flaxseed, Linseed, (FLAX SEED OIL PO), Take by mouth daily., Disp: , Rfl:  .  fluticasone (FLONASE) 50 MCG/ACT nasal spray, Place 2 sprays into the nose as needed. , Disp: , Rfl:  .  LOTEMAX 0.5 % GEL, Place 1 drop into the right eye 2 (two) times daily. , Disp: , Rfl:  .  Nintedanib (OFEV) 100 MG CAPS, Take 100 mg by mouth 2 (two) times daily., Disp: 60 capsule, Rfl: 6 .  NON FORMULARY, [place] supplement twice daily, Disp: , Rfl:  .  tamsulosin (FLOMAX) 0.4 MG CAPS capsule, Take 0.4 mg by mouth daily., Disp: , Rfl:  .  ZIOPTAN 0.0015 % SOLN, Place 1 drop into the right eye daily., Disp: , Rfl:    Review of Systems     Objective:   Physical Exam  Constitutional: He is oriented to person, place, and time. He appears well-developed and well-nourished. No distress.  HENT:  Head: Normocephalic and atraumatic.  Right Ear: External ear normal.   Left Ear: External ear normal.  Mouth/Throat: Oropharynx is clear and moist. No oropharyngeal exudate.  Eyes: Pupils are equal, round, and reactive to light. Conjunctivae and EOM are normal. Right eye exhibits no discharge. Left eye exhibits no discharge. No scleral icterus.  Neck: Normal range of motion. Neck supple. No JVD present. No tracheal deviation present. No thyromegaly present.  Cardiovascular: Normal rate, regular rhythm and intact distal pulses. Exam reveals no gallop and no friction rub.  No murmur heard. Pulmonary/Chest: Effort normal. No respiratory distress. He has no wheezes. He has rales. He exhibits no tenderness.  Abdominal: Soft. Bowel sounds are normal. He exhibits no distension and no mass. There is no tenderness. There is no rebound and no guarding.  Musculoskeletal: Normal range of motion. He exhibits no edema or tenderness.  Lymphadenopathy:    He has no cervical adenopathy.  Neurological: He is alert and oriented to person, place, and time. He has normal reflexes. No cranial nerve deficit. Coordination normal.  Skin: Skin is warm and dry. No rash noted. He is not diaphoretic. No erythema. No pallor.  Psychiatric: He has a normal mood and affect. His behavior is normal. Judgment and thought content normal.  Nursing note and vitals reviewed.  Vitals:   08/21/17 1520  BP: 120/64  Pulse: 67  SpO2: 96%  Weight: 186 lb 6.4 oz (84.6 kg)  Height: 6\' 1"  (1.854 m)    Estimated body mass index is 24.59 kg/m as calculated from the following:   Height as of this encounter: 6\' 1"  (1.854 m).   Weight as of this encounter: 186 lb 6.4 oz (84.6 kg).      Assessment:       ICD-10-CM   1. Idiopathic pulmonary fibrosis (HCC) J84.112 Hepatic function panel  2. Therapeutic drug monitoring Z51.81 Hepatic function panel        Plan:      3.4% decline in FVC in 1years Overall slow steady decline with ofev 100mg  twice daily on board  Plan  - continue oxygen with  exertion - Check liver function test 08/21/2017  - for consideration  -Give one-week holiday for ofev  - and then started Ofev 100 mg twice daily and then one week later escalate to 150 mg twice daily  0- monitor for GI side effects  - The basis for this approach is to prevent continued slow steady worsening of lung function provided your GI system can tolerate it  Follow-up 8 weeks or sooner if needed - interstitial lung disease clinic (change from the usual 6 months given the fact we are trying to escalate ofev)  > 50% of this > 25 min visit spent in face to face counseling or coordination of care - by this undersigned MD - Dr Kalman Shan. This includes one or more of the following documented above: discussion of test results, diagnostic or treatment recommendations, prognosis, risks and benefits of management options, instructions, education, compliance or risk-factor reduction     [name], M.D., [facility].C.P Pulmonary and Critical Care Medicine Staff Physician, [facility] Center Director - Interstitial Lung Disease  Program  Pulmonary Fibrosis [facility] at [facility] [place], [place], [address]  Pager: [phone], If no answer or between  15:00h - 7:00h: call [phone] Telephone: [phone]

## 2017-08-21 NOTE — Patient Instructions (Addendum)
ICD-10-CM   1. Idiopathic pulmonary fibrosis (HCC) J84.112   2. Therapeutic drug monitoring Z51.81     Results for Carlyle BasquesLENNON, Kyaire H (MRN [id number]) as of 08/21/2017 15:50  Ref. Range 08/30/2013 09:56 05/11/2014 09:54 09/29/2014 11:51 09/12/2015 10:46 04/22/2016 09:30 07/25/2016 11:19 02/09/2017 10:57 08/18/2017 15:57  FVC-Pre Latest Units: L 3.89 3.76 3.80 3.49 3.44 3.37 3.25 3.08  FVC-%Pred-Pre Latest Units: % 89 87 88 82 81 80 77 74   3.4% decline in FVC in 1years Overall slow steady decline with ofev 100mg  twice daily on board  Plan  - continue oxygen with exertion - Check liver function test 08/21/2017  - for consideration  -Give one-week holiday for ofev  - and then started Ofev 100 mg twice daily and then one week later escalate to 150 mg twice daily  0- monitor for GI side effects  - The basis for this approach is to prevent continued slow steady worsening of lung function provided your GI system can tolerate it  Follow-up 8 weeks or sooner if needed - interstitial lung disease clinic

## 2017-08-25 LAB — PULMONARY FUNCTION TEST
DL/VA % pred: 48 %
DL/VA: 2.31 ml/min/mmHg/L
DLCO COR % PRED: 29 %
DLCO COR: 10.83 ml/min/mmHg
DLCO unc % pred: 30 %
DLCO unc: 10.98 ml/min/mmHg
FEF 25-75 Pre: 2.27 L/sec
FEF2575-%PRED-PRE: 122 %
FEV1-%Pred-Pre: 88 %
FEV1-PRE: 2.57 L
FEV1FVC-%Pred-Pre: 112 %
FEV6-%PRED-PRE: 83 %
FEV6-Pre: 3.24 L
FEV6FVC-%Pred-Pre: 107 %
FVC-%PRED-PRE: 77 %
FVC-PRE: 3.25 L
PRE FEV6/FVC RATIO: 100 %
Pre FEV1/FVC ratio: 79 %

## 2017-08-28 ENCOUNTER — Telehealth: Payer: Self-pay | Admitting: Internal Medicine

## 2017-08-28 NOTE — Telephone Encounter (Signed)
ATC to call [facility] and was on hold be greater then .  Will call back.

## 2017-08-31 MED ORDER — NINTEDANIB ESYLATE 150 MG PO CAPS: 150.0000 mg | ORAL_CAPSULE | Freq: Two times a day (BID) | ORAL | 11 refills | Status: DC

## 2017-08-31 NOTE — Telephone Encounter (Signed)
Per last OV note, pt was to increase to 150mg  Ofev.  Called in rx to pharmacy.  Nothing further needed.

## 2017-09-08 ENCOUNTER — Telehealth: Payer: Self-pay | Admitting: Internal Medicine

## 2017-09-08 NOTE — Telephone Encounter (Signed)
Spoke with pt. States that he is ready to increase his OFEV back to 150mg . Pt has not received his shipment yet. Advised him to contact [facility] as this prescription was sent to them on 08/31/17. Nothing further was needed at this time.

## 2017-10-15 ENCOUNTER — Ambulatory Visit: Payer: Medicare Other | Admitting: Internal Medicine

## 2017-10-15 ENCOUNTER — Encounter: Payer: Self-pay | Admitting: Internal Medicine

## 2017-10-15 ENCOUNTER — Other Ambulatory Visit (INDEPENDENT_AMBULATORY_CARE_PROVIDER_SITE_OTHER): Payer: Medicare Other

## 2017-10-15 VITALS — BP 124/72 | HR 67 | Ht 73.0 in | Wt 185.2 lb

## 2017-10-15 DIAGNOSIS — J84112 Idiopathic pulmonary fibrosis: Secondary | ICD-10-CM

## 2017-10-15 DIAGNOSIS — Z5181 Encounter for therapeutic drug level monitoring: Secondary | ICD-10-CM | POA: Diagnosis not present

## 2017-10-15 LAB — HEPATIC FUNCTION PANEL
ALBUMIN: 4.2 g/dL (ref 3.5–5.2)
ALK PHOS: 47 U/L (ref 39–117)
ALT: 21 U/L (ref 0–53)
AST: 14 U/L (ref 0–37)
Bilirubin, Direct: 0.1 mg/dL (ref 0.0–0.3)
TOTAL PROTEIN: 7.8 g/dL (ref 6.0–8.3)
Total Bilirubin: 0.4 mg/dL (ref 0.2–1.2)

## 2017-10-15 NOTE — Addendum Note (Signed)
Addended by: Jacquiline DoeROGDON, Dezaria Methot M on: 10/15/2017 03:09 PM   Modules accepted: Orders

## 2017-10-15 NOTE — Progress Notes (Signed)
Subjective:     Patient ID: Darrell Ware, male   DOB: 1926/05/30, 84 y.o.   MRN: [id number]  HPI   OV 09/01/2014  Chief Complaint  Patient presents with  . Follow-up    Pt is a former [facility] pt. Pt states he was on OFEV but stoppped March 2016. Pt stated he rapidly declined since not being on the OFEV. When pt saw [facility] pt requested to be placed back on OFEV. Pt is currently on 150mg  BID. Pt c/o DOE, intermittent cough.    Follow-up idiopathic pulmonary fibrosis (diagnosis based on age, male sex, negative autoimmune profile and a CT scan of the chest April 2013]  This is a transfer of care. Patient is to be followed by Dr. Marcelyn BruinsKeith Clance and once by Dr. Clayton LefortV Sood for IPF. He is now following with me for Specialist IPF care. Presents with his wife both give history.  IPF diagnosed in spring of 2013. Then in the summer of 2014 through the expanded access program was on Pirfenidone but developed extremely high liver function anormlaity and had to be discontinued. Subsequently in the summer of 2015 was started on Nintedanib (Ofev) . He tolerated this well for 6 months. Then in the early part of 2016 started developing diarrhea with associated anorexia and weight loss. Symptoms are progressive. Symptoms ultimately became severe o he had abruptly discontinue the drug. Subsequently within a few days he started feeling dramatically better and all side effects resolved. However since that time he is having progressive dyspnea. He feels his interstitial lung disease is worse. A year ago overnight oxygen study and exertional pulse ox while he exercises on a recumbent bike at home showed no desaturation. However now for the last few months he is noticing desaturation into the 85% when he exercises. Correspondingly 2 days ago at our pulmonary office when he exerted he did desaturate to below 88%. He knows wants to restart his Ninetedanib ; wife wants to start him at low dose. He is currently taking old supply at the full  dose at 150 mg twice daily. He is open to attending pulmonary rehabilitation because his home exercise program has trailed off given his worsening symptoms. He is active in the pulmonary fibrosis foundation support group  His pulmonary function test within July 2015 when he was started on Ninntedanib and through March 2016 when he came off the drug have remained stable with moderate disease severity only. His FVC is 3.76 L/87%, total lung capacity is 5.12 L/66% and DLCO is 13.7/37%.    09/29/2014 Follow up : IPF and Chronic Hypoxic Resp Failure  Patient returns for a 4 week follow-up Patient was seen last visit with change of his pulmonary fibrosis medicine, OFEV , to lower dose to help with potential GI side effects. He is currently on 100 mg twice daily. He says he is tolerating well. Does have some mild nausea, but is tolerable. We discussed getting lab work today with LFTs. Patient was also started on oxygen with activity. He says this is really helped with his exercise regimen. He is waiting on his portable concentrator to be delivered. Order was checked on through his DME company today and has been approved. He has been referred to pulmonary rehabilitation, he is currently waiting for call for initiation of program. PFTs were repeated today that are similar to 2013. FVC 88%, FEV100%, ratio 80, diffusing capacity 38%. Patient says overall he feels that he is improved. He does get fatigued but feels that  this is slightly improved. He tries to exercise daily. He travels quite a bit with his wife. Is planning a trip to [place] in February.    OV 11/24/2014   Chief Complaint  Patient presents with  . Follow-up    pt. states breathing is baseline. SOB at baseline. dry cough is baseline. denies chest pain/tightness.   Follow-up idiopathic pulmonary fibrosis - on low dose ofev due to gi side effects   He presents with his wife. He is tolerating the low-dose of Ofev quite well. Does not have  any GI side effects. Overall he and his wife attest that his pulmonary health is stable. Looking at the interstitial lung disease questionnaire below it is obvious that he has significant dyspnea when he climbs stairs or hill but otherwise is a high performing individual. He is planning a trip in [place] in January this is an annual winter trip where he goes for 2 weeks. Otherwise no new problems. He was attending the pulmonary fibrosis foundation support group yesterday. He uses oxygen with exertion   Last the function test August 2016 was normal this is for monitoring on Ofev which is needed every 3 months  OV 02/14/2015  Chief Complaint  Patient presents with  . Follow-up    Pt states his breathing is unchanged since last OV. Pt states he feels a slight decline in breathing since one year ago - DOE. Pt c/o dry cough and intermittent chest discomfort.    Follow-up idiopathic pulmonary fibrosis-on nintedanib since summer 2015 and on low-dose since July 2016 seen due to GI side effects [hepatotoxicity with Esbriet 2014]  He has completed pulmonary rehabilitation. He says he learned a lot. His 6 minute walk distance improved with rehabilitation. He feels stable. However the rehabilitation specialist several options oxygen with exertion to 4 L. He does not think this is due to her decline. He thinks this is more due to his baseline requirement. Liver function test yesterday was normal. Spirometry today is FVC 3.5 L/72% and it is a decline since August 2016 PFT but as is often the case in our practice the PFT and spirometry is do not fully correlate. So I will taken at face value that he is stable. He is due to go to [place] in January 2017 and he wanted me to fill out paperwork. He wants to use the oxygen in flight and during landing.   OV 09/12/2015  Chief Complaint  Patient presents with  . Follow-up    review pft.  pt states his breathing is unchanged- SOB with exertion.    Follow-up idiopathic  pulmonary fibrosis: is-on nintedanib since summer 2015 and on low-dose since July 2016 seen due to GI side effects [hepatotoxicity with Esbriet 2014]  Last seen December 2016. After that he went 5 weeks to [place]. He and his wife enjoyed themselves a lot. This is an annual condition for them. Overall he is reporting stability in the last 6 months. However compared to a year or 2 ago he feels the dz  slowly gradually progressive. In fact on forced vital capacity he's had 8% progression in that in 2 years and 5.7% progression in 1 year with 0% progression in 6 months. His FVC and July 2015 was 3.89 he did in August 2016 it was 3.8. In December 2016 was 3.5 and currently it is 3.58. There is also some progression and DLCO over time. He is fully active. Last liver function test was December 2016.    OV 04/24/2016  Chief Complaint  Patient presents with  . Follow-up    Pt states he got the flu in mid Jan 2018 and states he does not feel back to baseline. Pt c/o cough but states this is due to eating peanuts the other day. Pt denies CP/tightness and f/c/s.    Follow-up idiopathic pulmonary fibrosis on low-dose Ofev with prior history of hepatotoxicity with Pirfenidone (Esbriet)  Mid-January 2018 he had influenza and was treated outpatient basis with prednisone and Tamiflu. Since then he slowly recovered and is almost at his baseline. He did go to his annual [place] trip. He did use oxygen on flight. He uses oxygen on with exertion and when and air borne. - at end dropped to 87%; few days ago. In terms of his symptoms compared to last summer he feels overall stable except for the interim influenza. However in comparing the interstitial lung disease questionnaire from one half years ago scoring shows slight decline. Pulmicort function tests also show slight decline over time but only 1% variation decline compared to 6 months ago.   OV 07/25/2016  Chief Complaint  Patient presents with  . Follow-up     review pft.  pt states he is doing well, notes low bp.     Follow-up idiopathic pulmonary fibrosis (diagnosis based on age, male sex, negative autoimmune profile and a CT scan of the chest April   For folllwup. Overall stable but mothers day 2018 got exposed to huge pollen load while helping son with car dent. Then had low bp, cough and wheeze and fatigute but now slowly getting better but stil with occ wheeze. Tolerating ofev lower dose just fine. Overall compared to a year ago he feels he has progressed somewhat. FVC decline in 1 year is 3%. Due to hx of wheeze we did feno and is 30s ppb and elevated     OV 11/04/2016  Chief Complaint  Patient presents with  . Follow-up    Pt complains of no symptoms other than SOB [place]. Pt has non productive dry cough.     idiopathic pulmonary fibrosis follow-up. He is on low dose Ofev. Diagnosis greater than 5 years ago. He said 3% decline in lung function as of March/June 2018. Last visit was in June 2018. He continues on his Ofev 100 mg twice daily low dose. He is able to tolerated. Liver function tests in June 2018 was normal. He has deferred as high dose flu shot today. He will have it next month. Since his last visit he was up in the mountains at [place], [place] and then ran out of oxygen and got very hypoxemic. Currently he is back to baseline. There are no new issues.  OV 02/09/2017  Chief Complaint  Patient presents with  . Follow-up    PFT done today. States that he has been doing good. Has occ. SOB with exertion and a cough that comes and goes. Denies any CP.     presents for follow-up. Overall he is on low-dose tablet. Diagnosis will be 6 years in March 2019. He tells me that he feels a slow steady progression of the disease over the course of the last 1 year or so. Pulmonary function test shows a 7% decline in [facility] and he believes that he has decline by that extent. [facility] interstitial lung disease  questionnaire shows worsening of score because this course are getting lower. He feels he can benefit from a handicap placard and  also applying for [employer] priority list for electricity supply during power outage. He says he holds his breath and this helps him. We felt that maybe an incentive spirometer would help him. He is tolerating his low-dose of an impediment just fine. KBILD stable   OV 08/21/2017  Chief Complaint  Patient presents with  . Follow-up    Pt states he has been doing well since last visit. Pt thinks IPF is gradually progressing slowly. PFT was performed 6/25. Pt does wear 4-5L with exertion and also wears 2L O2 at night   [address] , 84 y.o. , with dob [birth year] and male ,Not Hispanic or Latino from [address] [place] [address] - presents to lung clinic for ipf followup. Presents with his wife as always. He tells me that he's been on ofev for 3 or 4 years. Initially was in the high dosebut then developed GI discomfort and then reduce to the lower dose of 100 mg twice daily which he has continued without much of a problem except for on and off occasional nonspecific abdominal pain that is mild and transient. He said serial liver function with [place] that always been normal so far. He tells me in the last 6 months since I saw him he continues to have progressive worsening of shortness of breath. He needs 4 L with exertion but nothing at rest. He continues daily exercise and maintains significant activity at home. We discussed the possibility of going back up on the full dose given the decline. He is thinking about it. Your lung function test today and his FVC has declined further 3% compared to last visit. The entire profile as listed below   OV 10/15/2017   Chief Complaint  Patient presents with  . Follow-up    Pt did increase the OFEV to the 150mg  and states he has had some occ. diarrhea and some GI upsets, which he did have with the lower dose of the OFEV, but  other than that, he has had no real problems. Pt states breathing is about the same. Pt states he has been able to tolerate the heat with his breathing but does have problems with hills.     Follow-up idiopathic pulmonary fibrosis  This visit is a visit after increasing his ofev from 100 mg twice daily to 150 mg twice daily. We did this because his lung function a decline somewhat in the past one year. He tells me that after increasing his dose that his diarrhea and GI discomfort is around the same. And otherwise this has not gotten worse. However it appears that he might be more fatigued. His wife feels it is because of ofev. He has no other reason. At this point in time he has resolved to continue with ful dose ofev and see what happens. He has oxygen for exertion and at night. He does have chronic cough which she says is variable in severity and it rates it as anywhere between 3-5 on a 10 point scale  K-BILD ILD QUESTIONNAIRE, Symptom score over prior 2 weeks  7-none, 6-rarely, 5-occ, 5-some times, 3-sev times, 2-most times, 1-every time 11/24/2014  04/24/2016  02/09/2017   Dyspnea for stairs, incline or hill 2 2 3   Chest Tightness 6 4 4   Worry about seriousness of lung complaint 5 5 5   Avoided doing things that make you dyspneic 5 6 5   Have you felt loss of control of lung condition (reversed from original) 6 6 6  Felt fed up due to lung condition 5 6 5   Felt urge to breathe aka air hunger 6 6 6   Has lung condition made you feel anxious 6 6 6   How often have you experienced wheezing or whistling sound 6 5 4   How much of the time have you felt your lung dz is getting worse 7 6 5   How much has your lung condition interfered with job or daily task 5 5 6   Were you expecting your lung condition to get worse 7 6 6   How much has your lung function limited you carrying things like groceris 6 5 5   How much has your lung function made you think of EOL? 6 6 5   Total 78 68 71  Are you financially  worse off 6 6 4   Grand Total 84 72 75     Simple office walk 185 feet x  3 laps goal with forehead probe 10/15/2017   O2 used Room air  Number laps completed 3  Comments about pace Normal pace  Resting Pulse Ox/HR 100% and 68/min  Final Pulse Ox/HR 87% and 83/min  Desaturated </= 88% yes  Desaturated <= 3% points yes  Got Tachycardic >/= 90/min no  Symptoms at end of test Moderate dyspnea  Miscellaneous comments x     Results for OTILIO, GROLEAU (MRN [id number]) as of 08/21/2017 15:50  Ref. Range 08/30/2013 09:56 05/11/2014 09:54 09/29/2014 11:51 09/12/2015 10:46 04/22/2016 09:30 07/25/2016 11:19 02/09/2017 10:57 08/18/2017 15:57  FVC-Pre Latest Units: L 3.89 3.76 3.80 3.49 3.44 3.37 3.25 3.08  FVC-%Pred-Pre Latest Units: % 89 87 88 82 81 80 77 74    Results for PACE, LAMADRID (MRN [id number]) as of 08/21/2017 15:50  Ref. Range 08/30/2013 09:56 05/11/2014 09:54 09/29/2014 11:51 09/12/2015 10:46 04/22/2016 09:30 07/25/2016 11:19 02/09/2017 10:57 08/18/2017 15:57  DLCO unc Latest Units: ml/min/mmHg 13.51 13.71 13.83 13.39  12.17 10.98 10.43  DLCO unc % pred Latest Units: % 37 37 38 36  33 30 28       has a past medical history of Atelectasis, Chronic bronchitis, Hemorrhoid, Inguinal hernia, Postural hypotension, and RBBB (2010).   reports that he quit smoking about 64 years ago. His smoking use included pipe and cigars. He has a 0.40 pack-year smoking history. He has never used smokeless tobacco.  Past Surgical History:  Procedure Laterality Date  . CATARACT EXTRACTION     bilat  . CORNEAL TRANSPLANT  1999   Right  . left arm fracture  1950  . RETINAL DETACHMENT SURGERY     Bilat  . TONSILLECTOMY  1935  . VASECTOMY  1972    Allergies  Allergen Reactions  . Peanut-Containing Drug Products     Cough,wheezing - GENERAL PEANUT ALLERGY  . Pirfenidone     Liver trouble  . Cialis [Tadalafil] Rash    Immunization History  Administered Date(s) Administered  . Influenza Split 11/25/2011,  01/02/2014  . Influenza, High Dose Seasonal PF 11/25/2015, 01/09/2017  . Influenza,inj,Quad PF,6+ Mos 11/24/2012, 11/25/2014  . Pneumococcal-Unspecified 04/24/2012    Family History  Problem Relation Age of Onset  . Heart failure Father   . Heart failure Mother   . Macular degeneration Brother   . Macular degeneration Sister      Current Outpatient Medications:  .  AZOPT 1 % ophthalmic suspension, Place 1 drop into the right eye 3 (three) times daily. , Disp: , Rfl:  .  Beta Sitosterol 40 % POWD, 1 tablet by Does  not apply route 2 (two) times daily., Disp: , Rfl:  .  Cyanocobalamin (B-12) 2500 MCG SUBL, Place under the tongue daily., Disp: , Rfl:  .  Flaxseed, Linseed, (FLAX SEED OIL PO), Take by mouth daily., Disp: , Rfl:  .  fluticasone (FLONASE) 50 MCG/ACT nasal spray, Place 2 sprays into the nose as needed. , Disp: , Rfl:  .  LOTEMAX 0.5 % GEL, Place 1 drop into the right eye 2 (two) times daily. , Disp: , Rfl:  .  Nintedanib (OFEV) 150 MG CAPS, Take 1 capsule (150 mg total) by mouth 2 (two) times daily., Disp: 60 capsule, Rfl: 11 .  NON FORMULARY, [place]aklee supplement twice daily, Disp: , Rfl:  .  tamsulosin (FLOMAX) 0.4 MG CAPS capsule, Take 0.4 mg by mouth daily., Disp: , Rfl:  .  ZIOPTAN 0.0015 % SOLN, Place 1 drop into the right eye daily., Disp: , Rfl:    Review of Systems     Objective:   Physical Exam  Constitutional: He is oriented to person, place, and time. He appears well-developed and well-nourished. No distress.  HENT:  Head: Normocephalic and atraumatic.  Right Ear: External ear normal.  Left Ear: External ear normal.  Mouth/Throat: Oropharynx is clear and moist. No oropharyngeal exudate.  Eyes: Pupils are equal, round, and reactive to light. Conjunctivae and EOM are normal. Right eye exhibits no discharge. Left eye exhibits no discharge. No scleral icterus.  Neck: Normal range of motion. Neck supple. No JVD present. No tracheal deviation present. No  thyromegaly present.  Cardiovascular: Normal rate, regular rhythm and intact distal pulses. Exam reveals no gallop and no friction rub.  No murmur heard. Pulmonary/Chest: Effort normal. No respiratory distress. He has no wheezes. He has rales. He exhibits no tenderness.  Abdominal: Soft. Bowel sounds are normal. He exhibits no distension and no mass. There is no tenderness. There is no rebound and no guarding.  Musculoskeletal: Normal range of motion. He exhibits no edema or tenderness.  Lymphadenopathy:    He has no cervical adenopathy.  Neurological: He is alert and oriented to person, place, and time. He has normal reflexes. No cranial nerve deficit. Coordination normal.  Skin: Skin is warm and dry. No rash noted. He is not diaphoretic. No erythema. No pallor.  Psychiatric: He has a normal mood and affect. His behavior is normal. Judgment and thought content normal.  Nursing note and vitals reviewed.  Vitals:   10/15/17 1408  BP: 124/72  Pulse: 67  SpO2: 97%  Weight: 185 lb 3.2 oz (84 kg)  Height: 6\' 1"  (1.854 m)    Estimated body mass index is 24.43 kg/m as calculated from the following:   Height as of this encounter: 6\' 1"  (1.854 m).   Weight as of this encounter: 185 lb 3.2 oz (84 kg).      Assessment:       ICD-10-CM   1. Idiopathic pulmonary fibrosis (HCC) J84.112   2. Therapeutic drug monitoring Z51.81        Plan:      IPF appears stable You are handling full dose ofev well but for fatigue  PLAN We resolved you would continue full dose ofev at 150mg  twice daily and monitor fatigue and diarrhea Check LFT 10/15/2017  Do spirometry and dlco in 3 months  Followup Return to see me in 3 months but after spiro and dlco; at followup could consider you for cough study    [name], M.D., [facility].C.P Pulmonary and Critical  Care Medicine Staff Physician, [facility] Center Director - Interstitial Lung Disease  Program  Pulmonary Fibrosis [facility] at [facility] [place], [place], [address]  Pager: [phone], If no answer or between  15:00h - 7:00h: call [phone] Telephone: [phone]

## 2017-10-15 NOTE — Patient Instructions (Signed)
ICD-10-CM   1. Idiopathic pulmonary fibrosis (HCC) J84.112   2. Therapeutic drug monitoring Z51.81     IPF appears stable You are handling full dose ofev well but for fatigue  PLAN We resolved you would continue full dose ofev at 150mg  twice daily and monitor fatigue and diarrhea Check LFT 10/15/2017  Do spirometry and dlco in 3 months  Followup Return to see me in 3 months but after spiro and dlco; at followup could consider you for cough study

## 2017-11-17 ENCOUNTER — Telehealth: Payer: Self-pay | Admitting: Internal Medicine

## 2017-11-17 NOTE — Telephone Encounter (Signed)
Patient is aware of recommendation and verbalized understanding

## 2017-11-17 NOTE — Telephone Encounter (Signed)
Sorry to hear. Yes, ok with lower dose 100mg  twice daily. IF symptoms return at lower dose please alert me straight away

## 2017-11-17 NOTE — Telephone Encounter (Signed)
Called and spoke to patient. Patient stated that he had started on the 150 mg of Ofev and on the 3 week the side effects became unbearable. Patient stated he was having diarrhea and severe abdominal pains. Patient stated the abdominal pain was so bad it was waking him up at night. Patient reported that he stopped the Ofev for a week. Patient shared that he has now started back taking the 100 mg dose twice a day. Patient wanted to give Dr. Marchelle Gearingamaswamy an update and see if it's okay if he stays on the lower dose?  Patient said if agreeable please send in the lower dose to the specialty pharmacy for the next refill.   MR please advise.

## 2017-11-30 ENCOUNTER — Telehealth: Payer: Self-pay | Admitting: Internal Medicine

## 2017-11-30 MED ORDER — NINTEDANIB ESYLATE 100 MG PO CAPS: 100.0000 mg | ORAL_CAPSULE | Freq: Two times a day (BID) | ORAL | 5 refills | Status: DC

## 2017-11-30 NOTE — Telephone Encounter (Signed)
Called and spoke to [facility] pharmacy, they neededed to clarify the Ofev order for the patient, per last note from MR ofev has been decreased to 100mg  BID. Confirmed pharmacy. Script sent. Nothing further needed at this time.

## 2018-01-15 ENCOUNTER — Other Ambulatory Visit: Payer: Self-pay | Admitting: Internal Medicine

## 2018-01-15 DIAGNOSIS — J84112 Idiopathic pulmonary fibrosis: Secondary | ICD-10-CM

## 2018-01-18 ENCOUNTER — Ambulatory Visit (INDEPENDENT_AMBULATORY_CARE_PROVIDER_SITE_OTHER): Payer: Medicare Other | Admitting: Internal Medicine

## 2018-01-18 ENCOUNTER — Ambulatory Visit: Payer: Medicare Other | Admitting: Internal Medicine

## 2018-01-18 ENCOUNTER — Encounter: Payer: Self-pay | Admitting: Internal Medicine

## 2018-01-18 VITALS — BP 118/66 | HR 66 | Ht 75.0 in | Wt 186.2 lb

## 2018-01-18 DIAGNOSIS — J84112 Idiopathic pulmonary fibrosis: Secondary | ICD-10-CM | POA: Diagnosis not present

## 2018-01-18 DIAGNOSIS — Z5181 Encounter for therapeutic drug level monitoring: Secondary | ICD-10-CM

## 2018-01-18 LAB — PULMONARY FUNCTION TEST
DL/VA % pred: 51 %
DL/VA: 2.45 ml/min/mmHg/L
DLCO unc % pred: 30 %
DLCO unc: 11.27 ml/min/mmHg
FEF 25-75 PRE: 2.34 L/s
FEF2575-%PRED-PRE: 129 %
FEV1-%PRED-PRE: 90 %
FEV1-Pre: 2.61 L
FEV1FVC-%Pred-Pre: 113 %
FEV6-%PRED-PRE: 85 %
FEV6-Pre: 3.29 L
FEV6FVC-%Pred-Pre: 107 %
FVC-%Pred-Pre: 79 %
FVC-Pre: 3.29 L
PRE FEV1/FVC RATIO: 79 %
Pre FEV6/FVC Ratio: 100 %

## 2018-01-18 NOTE — Progress Notes (Signed)
OV 09/01/2014  Chief Complaint  Patient presents with  . Follow-up    Pt is a former [facility] pt. Pt states he was on OFEV but stoppped March 2016. Pt stated he rapidly declined since not being on the OFEV. When pt saw [facility] pt requested to be placed back on OFEV. Pt is currently on 150mg  BID. Pt c/o DOE, intermittent cough.    Follow-up idiopathic pulmonary fibrosis (diagnosis based on age, male sex, negative autoimmune profile and a CT scan of the chest April 2013]  This is a transfer of care. Patient is to be followed by Dr. Marcelyn Bruins and once by Dr. Clayton Lefort for IPF. He is now following with me for Specialist IPF care. Presents with his wife both give history.  IPF diagnosed in spring of 2013. Then in the summer of 2014 through the expanded access program was on Pirfenidone but developed extremely high liver function anormlaity and had to be discontinued. Subsequently in the summer of 2015 was started on Nintedanib (Ofev) . He tolerated this well for 6 months. Then in the early part of 2016 started developing diarrhea with associated anorexia and weight loss. Symptoms are progressive. Symptoms ultimately became severe o he had abruptly discontinue the drug. Subsequently within a few days he started feeling dramatically better and all side effects resolved. However since that time he is having progressive dyspnea. He feels his interstitial lung disease is worse. A year ago overnight oxygen study and exertional pulse ox while he exercises on a recumbent bike at home showed no desaturation. However now for the last few months he is noticing desaturation into the 85% when he exercises. Correspondingly 2 days ago at our pulmonary office when he exerted he did desaturate to below 88%. He knows wants to restart his Ninetedanib ; wife wants to start him at low dose. He is currently taking old supply at the full dose at 150 mg twice daily. He is open to attending pulmonary rehabilitation because his home  exercise program has trailed off given his worsening symptoms. He is active in the pulmonary fibrosis foundation support group  His pulmonary function test within July 2015 when he was started on Ninntedanib and through March 2016 when he came off the drug have remained stable with moderate disease severity only. His FVC is 3.76 L/87%, total lung capacity is 5.12 L/66% and DLCO is 13.7/37%.    09/29/2014 Follow up : IPF and Chronic Hypoxic Resp Failure  Patient returns for a 4 week follow-up Patient was seen last visit with change of his pulmonary fibrosis medicine, OFEV , to lower dose to help with potential GI side effects. He is currently on 100 mg twice daily. He says he is tolerating well. Does have some mild nausea, but is tolerable. We discussed getting lab work today with LFTs. Patient was also started on oxygen with activity. He says this is really helped with his exercise regimen. He is waiting on his portable concentrator to be delivered. Order was checked on through his DME company today and has been approved. He has been referred to pulmonary rehabilitation, he is currently waiting for call for initiation of program. PFTs were repeated today that are similar to 2013. FVC 88%, FEV100%, ratio 80, diffusing capacity 38%. Patient says overall he feels that he is improved. He does get fatigued but feels that this is slightly improved. He tries to exercise daily. He travels quite a bit with his wife. Is planning a trip  to [place] in February.    OV 11/24/2014   Chief Complaint  Patient presents with  . Follow-up    pt. states breathing is baseline. SOB at baseline. dry cough is baseline. denies chest pain/tightness.   Follow-up idiopathic pulmonary fibrosis - on low dose ofev due to gi side effects   He presents with his wife. He is tolerating the low-dose of Ofev quite well. Does not have any GI side effects. Overall he and his wife attest that his pulmonary health is stable.  Looking at the interstitial lung disease questionnaire below it is obvious that he has significant dyspnea when he climbs stairs or hill but otherwise is a high performing individual. He is planning a trip in [place] in January this is an annual winter trip where he goes for 2 weeks. Otherwise no new problems. He was attending the pulmonary fibrosis foundation support group yesterday. He uses oxygen with exertion   Last the function test August 2016 was normal this is for monitoring on Ofev which is needed every 3 months  OV 02/14/2015  Chief Complaint  Patient presents with  . Follow-up    Pt states his breathing is unchanged since last OV. Pt states he feels a slight decline in breathing since one year ago - DOE. Pt c/o dry cough and intermittent chest discomfort.    Follow-up idiopathic pulmonary fibrosis-on nintedanib since summer 2015 and on low-dose since July 2016 seen due to GI side effects [hepatotoxicity with Esbriet 2014]  He has completed pulmonary rehabilitation. He says he learned a lot. His 6 minute walk distance improved with rehabilitation. He feels stable. However the rehabilitation specialist several options oxygen with exertion to 4 L. He does not think this is due to her decline. He thinks this is more due to his baseline requirement. Liver function test yesterday was normal. Spirometry today is FVC 3.5 L/72% and it is a decline since August 2016 PFT but as is often the case in our practice the PFT and spirometry is do not fully correlate. So I will taken at face value that he is stable. He is due to go to [place] in January 2017 and he wanted me to fill out paperwork. He wants to use the oxygen in flight and during landing.   OV 09/12/2015  Chief Complaint  Patient presents with  . Follow-up    review pft.  pt states his breathing is unchanged- SOB with exertion.    Follow-up idiopathic pulmonary fibrosis: is-on nintedanib since summer 2015 and on low-dose since July 2016  seen due to GI side effects [hepatotoxicity with Esbriet 2014]  Last seen December 2016. After that he went 5 weeks to [place]. He and his wife enjoyed themselves a lot. This is an annual condition for them. Overall he is reporting stability in the last 6 months. However compared to a year or 2 ago he feels the dz  slowly gradually progressive. In fact on forced vital capacity he's had 8% progression in that in 2 years and 5.7% progression in 1 year with 0% progression in 6 months. His FVC and July 2015 was 3.89 he did in August 2016 it was 3.8. In December 2016 was 3.5 and currently it is 3.58. There is also some progression and DLCO over time. He is fully active. Last liver function test was December 2016.    OV 04/24/2016  Chief Complaint  Patient presents with  . Follow-up    Pt states he got the flu in mid  Jan 2018 and states he does not feel back to baseline. Pt c/o cough but states this is due to eating peanuts the other day. Pt denies CP/tightness and f/c/s.    Follow-up idiopathic pulmonary fibrosis on low-dose Ofev with prior history of hepatotoxicity with Pirfenidone (Esbriet)  Mid-January 2018 he had influenza and was treated outpatient basis with prednisone and Tamiflu. Since then he slowly recovered and is almost at his baseline. He did go to his annual [place] trip. He did use oxygen on flight. He uses oxygen on with exertion and when and air borne. - at end dropped to 87%; few days ago. In terms of his symptoms compared to last summer he feels overall stable except for the interim influenza. However in comparing the interstitial lung disease questionnaire from one half years ago scoring shows slight decline. Pulmicort function tests also show slight decline over time but only 1% variation decline compared to 6 months ago.   OV 07/25/2016  Chief Complaint  Patient presents with  . Follow-up    review pft.  pt states he is doing well, notes low bp.     Follow-up idiopathic  pulmonary fibrosis (diagnosis based on age, male sex, negative autoimmune profile and a CT scan of the chest April   For folllwup. Overall stable but mothers day 2018 got exposed to huge pollen load while helping son with car dent. Then had low bp, cough and wheeze and fatigute but now slowly getting better but stil with occ wheeze. Tolerating ofev lower dose just fine. Overall compared to a year ago he feels he has progressed somewhat. FVC decline in 1 year is 3%. Due to hx of wheeze we did feno and is 30s ppb and elevated     OV 11/04/2016  Chief Complaint  Patient presents with  . Follow-up    Pt complains of no symptoms other than SOB about [place]. Pt has non productive dry cough.     idiopathic pulmonary fibrosis follow-up. He is on low dose Ofev. Diagnosis greater than 5 years ago. He said 3% decline in lung function as of March/June 2018. Last visit was in June 2018. He continues on his Ofev 100 mg twice daily low dose. He is able to tolerated. Liver function tests in June 2018 was normal. He has deferred as high dose flu shot today. He will have it next month. Since his last visit he was up in the mountains at [place], [place] and then ran out of oxygen and got very hypoxemic. Currently he is back to baseline. There are no new issues.  OV 02/09/2017  Chief Complaint  Patient presents with  . Follow-up    PFT done today. States that he has been doing good. Has occ. SOB with exertion and a cough that comes and goes. Denies any CP.     presents for follow-up. Overall he is on low-dose tablet. Diagnosis will be 6 years in March 2019. He tells me that he feels a slow steady progression of the disease over the course of the last 1 year or so. Pulmonary function test shows a 7% decline in [facility] and he believes that he has decline by that extent. [facility] interstitial lung disease questionnaire shows worsening of score because this course are getting lower. He  feels he can benefit from a handicap placard and also applying for [employer] priority list for electricity supply during power outage. He says he holds his breath and  this helps him. We felt that maybe an incentive spirometer would help him. He is tolerating his low-dose of an impediment just fine. KBILD stable   OV 08/21/2017  Chief Complaint  Patient presents with  . Follow-up    Pt states he has been doing well since last visit. Pt thinks IPF is gradually progressing slowly. PFT was performed 6/25. Pt does wear 4-5L with exertion and also wears 2L O2 at night   [address] , 84 y.o. , with dob [birth year] and male ,Not Hispanic or Latino from [address] [place] [address] - presents to lung clinic for ipf followup. Presents with his wife as always. He tells me that he's been on ofev for 3 or 4 years. Initially was in the high dosebut then developed GI discomfort and then reduce to the lower dose of 100 mg twice daily which he has continued without much of a problem except for on and off occasional nonspecific abdominal pain that is mild and transient. He said serial liver function with [place] that always been normal so far. He tells me in the last 6 months since I saw him he continues to have progressive worsening of shortness of breath. He needs 4 L with exertion but nothing at rest. He continues daily exercise and maintains significant activity at home. We discussed the possibility of going back up on the full dose given the decline. He is thinking about it. Your lung function test today and his FVC has declined further 3% compared to last visit. The entire profile as listed below   OV 10/15/2017   Chief Complaint  Patient presents with  . Follow-up    Pt did increase the OFEV to the 150mg  and states he has had some occ. diarrhea and some GI upsets, which he did have with the lower dose of the OFEV, but other than that, he has had no real problems. Pt states breathing is about the  same. Pt states he has been able to tolerate the heat with his breathing but does have problems with hills.     Follow-up idiopathic pulmonary fibrosis  This visit is a visit after increasing his ofev from 100 mg twice daily to 150 mg twice daily. We did this because his lung function a decline somewhat in the past one year. He tells me that after increasing his dose that his diarrhea and GI discomfort is around the same. And otherwise this has not gotten worse. However it appears that he might be more fatigued. His wife feels it is because of ofev. He has no other reason. At this point in time he has resolved to continue with ful dose ofev and see what happens. He has oxygen for exertion and at night. He does have chronic cough which she says is variable in severity and it rates it as anywhere between 3-5 on a 10 point scale  K-BILD ILD QUESTIONNAIRE, Symptom score over prior 2 weeks  7-none, 6-rarely, 5-occ, 5-some times, 3-sev times, 2-most times, 1-every time 11/24/2014  04/24/2016  02/09/2017   Dyspnea for stairs, incline or hill 2 2 3   Chest Tightness 6 4 4   Worry about seriousness of lung complaint 5 5 5   Avoided doing things that make you dyspneic 5 6 5   Have you felt loss of control of lung condition (reversed from original) 6 6 6   Felt fed up due to lung condition 5 6 5   Felt urge to breathe aka air hunger 6 6  6  Has lung condition made you feel anxious 6 6 6   How often have you experienced wheezing or whistling sound 6 5 4   How much of the time have you felt your lung dz is getting worse 7 6 5   How much has your lung condition interfered with job or daily task 5 5 6   Were you expecting your lung condition to get worse 7 6 6   How much has your lung function limited you carrying things like groceris 6 5 5   How much has your lung function made you think of EOL? 6 6 5   Total 78 68 71  Are you financially worse off 6 6 4   Grand Total 84 72 75     OV 01/18/2018  Subjective:    Patient ID: Darrell Ware, male , DOB: 1926/12/16 , age 55 y.o. , MRN: [id number] , ADDRESS: [address] [place] [address]   01/18/2018 -   Chief Complaint  Patient presents with  . Follow-up    tighness and cough with clear mucus, SOB on exertion (O2)     HPI Darrell Ware 84 y.o. -presents with his wife for IPF follow-up.  Overall he is stable.  He tells me that at home he gets his exercise by using nasal cannula oxygen at 5-6 L from the oxygen cylinder and he paces back-and-forth.  First 15 minutes is not a problem but when he gets to 1 hour it becomes a problem.  He uses portable imaging system when climbing hills and walking outdoors.  He feels this is insufficient at 2 L.  He feels is because of the pulse system.  He is now taking Internet 100 mg twice daily.  He does not want to do 150 mg twice daily.  This is because the higher dose gave him abdominal pains.  Most recent liver function test October 15, 2017 was fine.  He had pulmonary function test today and it is stable.  Personally visualized.  He and his wife plan to go to [place] in February 2019 for 1 month.  However he feels that he is getting too old and thinks that shutting down on him overall.  Therefore he is rethinking this very carefully.  Simple office walk 185 feet x  3 laps goal with forehead probe 10/15/2017  01/18/2018 [address] sq feet x 2 laps  O2 used Room air Room air  Number laps completed 3  not done  Comments about pace Normal pace   Resting Pulse Ox/HR 100% and 68/min   Final Pulse Ox/HR 87% and 83/min   Desaturated </= 88% yes   Desaturated <= 3% points yes   Got Tachycardic >/= 90/min no   Symptoms at end of test Moderate dyspnea   Miscellaneous comments x      Results for Darrell Ware, Darrell Ware (MRN [id number]) as of 08/21/2017 15:50  Ref. Range 08/30/2013 09:56 05/11/2014 09:54 09/29/2014 11:51 09/12/2015 10:46 04/22/2016 09:30 07/25/2016 11:19 02/09/2017 10:57 08/18/2017 15:57 01/18/2018    FVC-Pre Latest Units: L 3.89 3.76 3.80 3.49 3.44 3.37 3.25 3.08 3.29  FVC-%Pred-Pre Latest Units: % 89 87 88 82 81 80 77 74 79%    Results for Darrell Ware, Darrell Ware (MRN [id number]) as of 08/21/2017 15:50  Ref. Range 08/30/2013 09:56 05/11/2014 09:54 09/29/2014 11:51 09/12/2015 10:46 04/22/2016 09:30 07/25/2016 11:19 02/09/2017 10:57 08/18/2017 15:57 01/18/2018   DLCO unc Latest Units: ml/min/mmHg 13.51 13.71 13.83 13.39  12.17 10.98 10.43 11.27  DLCO unc %  pred Latest Units: % 37 37 38 36  33 30 28 30%      ROS - per HPI     has a past medical history of Atelectasis, Chronic bronchitis, Hemorrhoid, Inguinal hernia, Postural hypotension, and RBBB (2010).   reports that he quit smoking about 64 years ago. His smoking use included pipe and cigars. He has a 0.40 pack-year smoking history. He has never used smokeless tobacco.  Past Surgical History:  Procedure Laterality Date  . CATARACT EXTRACTION     bilat  . CORNEAL TRANSPLANT  1999   Right  . left arm fracture  1950  . RETINAL DETACHMENT SURGERY     Bilat  . TONSILLECTOMY  1935  . VASECTOMY  1972    Allergies  Allergen Reactions  . Peanut-Containing Drug Products     Cough,wheezing - GENERAL PEANUT ALLERGY  . Pirfenidone     Liver trouble  . Cialis [Tadalafil] Rash    Immunization History  Administered Date(s) Administered  . Influenza Split 11/25/2011, 01/02/2014  . Influenza, High Dose Seasonal PF 11/25/2015, 01/09/2017, 12/29/2017  . Influenza,inj,Quad PF,6+ Mos 11/24/2012, 11/25/2014  . Pneumococcal-Unspecified 04/24/2012    Family History  Problem Relation Age of Onset  . Heart failure Father   . Heart failure Mother   . Macular degeneration Brother   . Macular degeneration Sister      Current Outpatient Medications:  .  AZOPT 1 % ophthalmic suspension, Place 2 drops into the right eye 3 (three) times daily. , Disp: , Rfl:  .  Beta Sitosterol 40 % POWD, 1 tablet by Does not apply route 2 (two) times daily., Disp:  , Rfl:  .  Cyanocobalamin (B-12) 2500 MCG SUBL, Place under the tongue daily., Disp: , Rfl:  .  Flaxseed, Linseed, (FLAX SEED OIL PO), Take by mouth daily., Disp: , Rfl:  .  fluticasone (FLONASE) 50 MCG/ACT nasal spray, Place 2 sprays into the nose as needed. , Disp: , Rfl:  .  LOTEMAX 0.5 % GEL, Place 1 drop into the right eye 2 (two) times daily. , Disp: , Rfl:  .  Nintedanib (OFEV) 100 MG CAPS, Take 1 capsule (100 mg total) by mouth 2 (two) times daily., Disp: 60 capsule, Rfl: 5 .  NON FORMULARY, Shaklee supplement twice daily, Disp: , Rfl:  .  ZIOPTAN 0.0015 % SOLN, Place 1 drop into the right eye daily., Disp: , Rfl:  .  tamsulosin (FLOMAX) 0.4 MG CAPS capsule, Take 0.4 mg by mouth daily., Disp: , Rfl:       Objective:   Vitals:   01/18/18 1147  BP: 118/66  Pulse: 66  SpO2: 95%  Weight: 186 lb 3.2 oz (84.5 kg)  Height: 6\' 3"  (1.905 m)    Estimated body mass index is 23.27 kg/m as calculated from the following:   Height as of this encounter: 6\' 3"  (1.905 m).   Weight as of this encounter: 186 lb 3.2 oz (84.5 kg).  @WEIGHTCHANGE @  [employer]   01/18/18 1147  Weight: 186 lb 3.2 oz (84.5 kg)     Physical Exam  General Appearance:    Alert, cooperative, no distress, appears stated age - yes , Deconditioned looking - no , OBESE  - no, Sitting on Wheelchair -  no  Head:    Normocephalic, without obvious abnormality, atraumatic  Eyes:    PERRL, conjunctiva/corneas clear,  Ears:    Normal TM's and external ear canals, both ears  Nose:   Nares normal,  septum midline, mucosa normal, no drainage    or sinus tenderness. OXYGEN ON  - no . Patient is @ ra   Throat:   Lips, mucosa, and tongue normal; teeth and gums normal. Cyanosis on lips - no  Neck:   Supple, symmetrical, trachea midline, no adenopathy;    thyroid:  no enlargement/tenderness/nodules; no carotid   bruit or JVD  Back:     Symmetric, no curvature, ROM normal, no CVA tenderness  Lungs:     Distress - no , Wheeze  no, Barrell Chest - no, Purse lip breathing - no, Crackles - yes   Chest Wall:    No tenderness or deformity.    Heart:    Regular rate and rhythm, S1 and S2 normal, no rub   or gallop, Murmur - no  Breast Exam:    NOT DONE  Abdomen:     Soft, non-tender, bowel sounds active all four quadrants,    no masses, no organomegaly. Visceral obesity - no  Genitalia:   NOT DONE  Rectal:   NOT DONE  Extremities:   Extremities - normal, Has Cane - no, Clubbing - no, Edema - no  Pulses:   2+ and symmetric all extremities  Skin:   Stigmata of Connective Tissue Disease - no  Lymph nodes:   Cervical, supraclavicular, and axillary nodes normal  Psychiatric:  Neurologic:   Pleasant - yes, Anxious - no, Flat affect - no  CAm-ICU - neg, Alert and Oriented x 3 - yes, Moves all 4s - yes, Speech - normal, Cognition - intact           Assessment:       ICD-10-CM   1. Idiopathic pulmonary fibrosis (HCC) J84.112 Hepatic function panel  2. Therapeutic drug monitoring Z51.81        Plan:     Patient Instructions     ICD-10-CM   1. Idiopathic pulmonary fibrosis (HCC) J84.112   2. Therapeutic drug monitoring Z51.81    PFT stable Clinically stable  Plan Check LFT (last aug 2019) anytime next week or two Continue  ofev 100mg  twice daily OK for [place] in feb 2019 if stable Next visit will do pneumovax  Followup 4-6 months do spirometry - no need for dlco 4-6 months regular clinic    > 50% of this > 25 min visit spent in face to face counseling or coordination of care - by this undersigned MD - Dr Kalman Shan. This includes one or more of the following documented above: discussion of test results, diagnostic or treatment recommendations, prognosis, risks and benefits of management options, instructions, education, compliance or risk-factor reduction    SIGNATURE    [name], M.D., F.C.C.P,  Pulmonary and Critical Care Medicine Staff Physician, [facility]  Director - Interstitial Lung Disease  Program  Pulmonary Fibrosis [facility] [place], [place], [address]  Pager: [phone], If no answer or between  15:00h - 7:00h: call [phone] Telephone: [phone]  12:22 PM 01/18/2018

## 2018-01-18 NOTE — Patient Instructions (Addendum)
ICD-10-CM   1. Idiopathic pulmonary fibrosis (HCC) J84.112   2. Therapeutic drug monitoring Z51.81    PFT stable Clinically stable  Plan Check LFT (last aug 2019) anytime next week or two Continue  ofev 100mg  twice daily OK for [place] in feb 2019 if stable Next visit will do pneumovax  Followup 4-6 months do spirometry - no need for dlco 4-6 months regular clinic

## 2018-01-18 NOTE — Progress Notes (Signed)
PFT done today. 

## 2018-01-28 ENCOUNTER — Other Ambulatory Visit (INDEPENDENT_AMBULATORY_CARE_PROVIDER_SITE_OTHER): Payer: Medicare Other

## 2018-01-28 DIAGNOSIS — J84112 Idiopathic pulmonary fibrosis: Secondary | ICD-10-CM | POA: Diagnosis not present

## 2018-01-28 LAB — HEPATIC FUNCTION PANEL
ALBUMIN: 4.3 g/dL (ref 3.5–5.2)
ALK PHOS: 48 U/L (ref 39–117)
ALT: 25 U/L (ref 0–53)
AST: 19 U/L (ref 0–37)
BILIRUBIN DIRECT: 0 mg/dL (ref 0.0–0.3)
Total Bilirubin: 0.4 mg/dL (ref 0.2–1.2)
Total Protein: 7.9 g/dL (ref 6.0–8.3)

## 2018-02-02 ENCOUNTER — Telehealth: Payer: Self-pay | Admitting: Internal Medicine

## 2018-02-02 NOTE — Telephone Encounter (Signed)
Patient returned call, CB [phone] or [phone].

## 2018-02-02 NOTE — Telephone Encounter (Signed)
Notes recorded by Kalman Shanamaswamy, Murali, MD on 01/28/2018 at 5:27 PM EST Lab       01/28/18           1610     AST     19      ALT     25      ALKPHOS   48      BILITOT   0.4      PROT     7.9      ALBUMIN   4.3       Liver function test is normal   Called pt but unable to reach him. Left message for pt to return call.

## 2018-02-02 NOTE — Telephone Encounter (Signed)
Called and spoke with pt's wife letting her know the results of pt's labwork. Vernona RiegerLaura expressed understanding. Nothing further needed.

## 2018-05-13 ENCOUNTER — Other Ambulatory Visit: Payer: Self-pay | Admitting: Internal Medicine

## 2018-05-19 ENCOUNTER — Ambulatory Visit: Payer: Medicare Other | Admitting: Internal Medicine

## 2018-05-19 ENCOUNTER — Telehealth: Payer: Self-pay | Admitting: Internal Medicine

## 2018-05-19 ENCOUNTER — Encounter: Payer: Self-pay | Admitting: Internal Medicine

## 2018-05-19 ENCOUNTER — Other Ambulatory Visit: Payer: Self-pay

## 2018-05-19 VITALS — BP 120/70 | HR 75 | Ht 75.0 in | Wt 184.8 lb

## 2018-05-19 DIAGNOSIS — J84112 Idiopathic pulmonary fibrosis: Secondary | ICD-10-CM

## 2018-05-19 DIAGNOSIS — Z5181 Encounter for therapeutic drug level monitoring: Secondary | ICD-10-CM

## 2018-05-19 DIAGNOSIS — L723 Sebaceous cyst: Secondary | ICD-10-CM | POA: Diagnosis not present

## 2018-05-19 DIAGNOSIS — L089 Local infection of the skin and subcutaneous tissue, unspecified: Secondary | ICD-10-CM | POA: Diagnosis not present

## 2018-05-19 LAB — CBC WITH DIFFERENTIAL/PLATELET
Basophils Absolute: 0 10*3/uL (ref 0.0–0.1)
Basophils Relative: 0.3 % (ref 0.0–3.0)
EOS PCT: 1 % (ref 0.0–5.0)
Eosinophils Absolute: 0.1 10*3/uL (ref 0.0–0.7)
HCT: 41.5 % (ref 39.0–52.0)
Hemoglobin: 14.1 g/dL (ref 13.0–17.0)
Lymphocytes Relative: 30.6 % (ref 12.0–46.0)
Lymphs Abs: 3.1 10*3/uL (ref 0.7–4.0)
MCHC: 34 g/dL (ref 30.0–36.0)
MCV: 91.7 fl (ref 78.0–100.0)
MONO ABS: 0.9 10*3/uL (ref 0.1–1.0)
Monocytes Relative: 8.9 % (ref 3.0–12.0)
Neutro Abs: 6 10*3/uL (ref 1.4–7.7)
Neutrophils Relative %: 59.2 % (ref 43.0–77.0)
Platelets: 204 10*3/uL (ref 150.0–400.0)
RBC: 4.52 Mil/uL (ref 4.22–5.81)
RDW: 13.6 % (ref 11.5–15.5)
WBC: 10.1 10*3/uL (ref 4.0–10.5)

## 2018-05-19 LAB — HEPATIC FUNCTION PANEL
ALK PHOS: 54 U/L (ref 39–117)
ALT: 22 U/L (ref 0–53)
AST: 19 U/L (ref 0–37)
Albumin: 4.1 g/dL (ref 3.5–5.2)
BILIRUBIN DIRECT: 0.1 mg/dL (ref 0.0–0.3)
BILIRUBIN TOTAL: 0.3 mg/dL (ref 0.2–1.2)
Total Protein: 7.7 g/dL (ref 6.0–8.3)

## 2018-05-19 LAB — BASIC METABOLIC PANEL
BUN: 20 mg/dL (ref 6–23)
CHLORIDE: 103 meq/L (ref 96–112)
CO2: 29 mEq/L (ref 19–32)
Calcium: 9.4 mg/dL (ref 8.4–10.5)
Creatinine, Ser: 0.87 mg/dL (ref 0.40–1.50)
GFR: 82.54 mL/min (ref >60.00)
Glucose, Bld: 86 mg/dL (ref 70–99)
POTASSIUM: 4.2 meq/L (ref 3.5–5.1)
Sodium: 138 mEq/L (ref 135–145)

## 2018-05-19 MED ORDER — PREDNISONE 10 MG PO TABS: ORAL_TABLET | ORAL | 0 refills | Status: DC

## 2018-05-19 MED ORDER — CEPHALEXIN 500 MG PO CAPS: 500.0000 mg | ORAL_CAPSULE | Freq: Three times a day (TID) | ORAL | 0 refills | Status: DC

## 2018-05-19 NOTE — Patient Instructions (Signed)
Idiopathic pulmonary fibrosis (HCC) Therapeutic drug monitoring  - very concerned your fibrosis worse - continue ofev - continue o2 with minimal exertion and night o2 - d/w Mrs Runde and consider home hospice early (once covid hits their services are going to be streteched( - check cbc, bmet, lft - consider taking - Please take Take prednisone 40mg  once daily x 3 days, then 30mg  once daily x 3 days, then 20mg  once daily x 3 days, then prednisone 10mg  once daily  x 3 days and stop   Infected sebaceous cyst  - cephalexin 500mg  three times daily x  5 days   Followup 3 months or sooner if needed Call us with any questions Hunker down for pandemic

## 2018-05-19 NOTE — Telephone Encounter (Signed)
MR please advise if you will be the attending for hospice care. Thank you.

## 2018-05-19 NOTE — Progress Notes (Signed)
OV 09/01/2014  Chief Complaint  Patient presents with   Follow-up    Pt is a former [facility] pt. Pt states he was on OFEV but stoppped March 2016. Pt stated he rapidly declined since not being on the OFEV. When pt saw [facility] pt requested to be placed back on OFEV. Pt is currently on 150mg  BID. Pt c/o DOE, intermittent cough.    Follow-up idiopathic pulmonary fibrosis (diagnosis based on age, male sex, negative autoimmune profile and a CT scan of the chest April 2013]  This is a transfer of care. Patient is to be followed by Dr. 10-14-1986 and once by Dr. Marcelyn Bruins for IPF. He is now following with me for Specialist IPF care. Presents with his wife both give history.  IPF diagnosed in spring of 2013. Then in the summer of 2014 through the expanded access program was on Pirfenidone but developed extremely high liver function anormlaity and had to be discontinued. Subsequently in the summer of 2015 was started on Nintedanib (Ofev) . He tolerated this well for 6 months. Then in the early part of 2016 started developing diarrhea with associated anorexia and weight loss. Symptoms are progressive. Symptoms ultimately became severe o he had abruptly discontinue the drug. Subsequently within a few days he started feeling dramatically better and all side effects resolved. However since that time he is having progressive dyspnea. He feels his interstitial lung disease is worse. A year ago overnight oxygen study and exertional pulse ox while he exercises on a recumbent bike at home showed no desaturation. However now for the last few months he is noticing desaturation into the 85% when he exercises. Correspondingly 2 days ago at our pulmonary office when he exerted he did desaturate to below 88%. He knows wants to restart his Ninetedanib ; wife wants to start him at low dose. He is currently taking old supply at the full dose at 150 mg twice daily. He is open to attending pulmonary rehabilitation because his home  exercise program has trailed off given his worsening symptoms. He is active in the pulmonary fibrosis foundation support group  His pulmonary function test within July 2015 when he was started on Ninntedanib and through March 2016 when he came off the drug have remained stable with moderate disease severity only. His FVC is 3.76 L/87%, total lung capacity is 5.12 L/66% and DLCO is 13.7/37%.    09/29/2014 Follow up : IPF and Chronic Hypoxic Resp Failure  Patient returns for a 4 week follow-up Patient was seen last visit with change of his pulmonary fibrosis medicine, OFEV , to lower dose to help with potential GI side effects. He is currently on 100 mg twice daily. He says he is tolerating well. Does have some mild nausea, but is tolerable. We discussed getting lab work today with LFTs. Patient was also started on oxygen with activity. He says this is really helped with his exercise regimen. He is waiting on his portable concentrator to be delivered. Order was checked on through his DME company today and has been approved. He has been referred to pulmonary rehabilitation, he is currently waiting for call for initiation of program. PFTs were repeated today that are similar to 2013. FVC 88%, FEV100%, ratio 80, diffusing capacity 38%. Patient says overall he feels that he is improved. He does get fatigued but feels that this is slightly improved. He tries to exercise daily. He travels quite a bit with his wife. Is planning a trip to  [place] in February.    OV 11/24/2014   Chief Complaint  Patient presents with   Follow-up    pt. states breathing is baseline. SOB at baseline. dry cough is baseline. denies chest pain/tightness.   Follow-up idiopathic pulmonary fibrosis - on low dose ofev due to gi side effects   He presents with his wife. He is tolerating the low-dose of Ofev quite well. Does not have any GI side effects. Overall he and his wife attest that his pulmonary health is stable.  Looking at the interstitial lung disease questionnaire below it is obvious that he has significant dyspnea when he climbs stairs or hill but otherwise is a high performing individual. He is planning a trip in [place] in January this is an annual winter trip where he goes for 2 weeks. Otherwise no new problems. He was attending the pulmonary fibrosis foundation support group yesterday. He uses oxygen with exertion   Last the function test August 2016 was normal this is for monitoring on Ofev which is needed every 3 months  OV 02/14/2015  Chief Complaint  Patient presents with   Follow-up    Pt states his breathing is unchanged since last OV. Pt states he feels a slight decline in breathing since one year ago - DOE. Pt c/o dry cough and intermittent chest discomfort.    Follow-up idiopathic pulmonary fibrosis-on nintedanib since summer 2015 and on low-dose since July 2016 seen due to GI side effects [hepatotoxicity with Esbriet 2014]  He has completed pulmonary rehabilitation. He says he learned a lot. His 6 minute walk distance improved with rehabilitation. He feels stable. However the rehabilitation specialist several options oxygen with exertion to 4 L. He does not think this is due to her decline. He thinks this is more due to his baseline requirement. Liver function test yesterday was normal. Spirometry today is FVC 3.5 L/72% and it is a decline since August 2016 PFT but as is often the case in our practice the PFT and spirometry is do not fully correlate. So I will taken at face value that he is stable. He is due to go to [place] in January 2017 and he wanted me to fill out paperwork. He wants to use the oxygen in flight and during landing.   OV 09/12/2015  Chief Complaint  Patient presents with   Follow-up    review pft.  pt states his breathing is unchanged- SOB with exertion.    Follow-up idiopathic pulmonary fibrosis: is-on nintedanib since summer 2015 and on low-dose since July 2016  seen due to GI side effects [hepatotoxicity with Esbriet 2014]  Last seen December 2016. After that he went 5 weeks to [place]. He and his wife enjoyed themselves a lot. This is an annual condition for them. Overall he is reporting stability in the last 6 months. However compared to a year or 2 ago he feels the dz  slowly gradually progressive. In fact on forced vital capacity he's had 8% progression in that in 2 years and 5.7% progression in 1 year with 0% progression in 6 months. His FVC and July 2015 was 3.89 he did in August 2016 it was 3.8. In December 2016 was 3.5 and currently it is 3.58. There is also some progression and DLCO over time. He is fully active. Last liver function test was December 2016.    OV 04/24/2016  Chief Complaint  Patient presents with   Follow-up    Pt states he got the flu in mid Jan  2018 and states he does not feel back to baseline. Pt c/o cough but states this is due to eating peanuts the other day. Pt denies CP/tightness and f/c/s.    Follow-up idiopathic pulmonary fibrosis on low-dose Ofev with prior history of hepatotoxicity with Pirfenidone (Esbriet)  Mid-January 2018 he had influenza and was treated outpatient basis with prednisone and Tamiflu. Since then he slowly recovered and is almost at his baseline. He did go to his annual [place] trip. He did use oxygen on flight. He uses oxygen on with exertion and when and air borne. - at end dropped to 87%; few days ago. In terms of his symptoms compared to last summer he feels overall stable except for the interim influenza. However in comparing the interstitial lung disease questionnaire from one half years ago scoring shows slight decline. Pulmicort function tests also show slight decline over time but only 1% variation decline compared to 6 months ago.   OV 07/25/2016  Chief Complaint  Patient presents with   Follow-up    review pft.  pt states he is doing well, notes low bp.     Follow-up idiopathic  pulmonary fibrosis (diagnosis based on age, male sex, negative autoimmune profile and a CT scan of the chest April   For folllwup. Overall stable but mothers day 2018 got exposed to huge pollen load while helping son with car dent. Then had low bp, cough and wheeze and fatigute but now slowly getting better but stil with occ wheeze. Tolerating ofev lower dose just fine. Overall compared to a year ago he feels he has progressed somewhat. FVC decline in 1 year is 3%. Due to hx of wheeze we did feno and is 30s ppb and elevated     OV 11/04/2016  Chief Complaint  Patient presents with   Follow-up    Pt complains of no symptoms other than SOB about [place]. Pt has non productive dry cough.     idiopathic pulmonary fibrosis follow-up. He is on low dose Ofev. Diagnosis greater than 5 years ago. He said 3% decline in lung function as of March/June 2018. Last visit was in June 2018. He continues on his Ofev 100 mg twice daily low dose. He is able to tolerated. Liver function tests in June 2018 was normal. He has deferred as high dose flu shot today. He will have it next month. Since his last visit he was up in the mountains at [place], [place] and then ran out of oxygen and got very hypoxemic. Currently he is back to baseline. There are no new issues.  OV 02/09/2017  Chief Complaint  Patient presents with   Follow-up    PFT done today. States that he has been doing good. Has occ. SOB with exertion and a cough that comes and goes. Denies any CP.     presents for follow-up. Overall he is on low-dose tablet. Diagnosis will be 6 years in March 2019. He tells me that he feels a slow steady progression of the disease over the course of the last 1 year or so. Pulmonary function test shows a 7% decline in [facility] and he believes that he has decline by that extent. [facility] interstitial lung disease questionnaire shows worsening of score because this course are getting lower. He  feels he can benefit from a handicap placard and also applying for [employer] priority list for electricity supply during power outage. He says he holds his breath and this  helps him. We felt that maybe an incentive spirometer would help him. He is tolerating his low-dose of an impediment just fine. KBILD stable   OV 08/21/2017  Chief Complaint  Patient presents with   Follow-up    Pt states he has been doing well since last visit. Pt thinks IPF is gradually progressing slowly. PFT was performed 6/25. Pt does wear 4-5L with exertion and also wears 2L O2 at night   [address] , 85 y.o. , with dob November 27, 1926 and male ,Not Hispanic or Latino from [address] [place] [address] - presents to lung clinic for ipf followup. Presents with his wife as always. He tells me that he's been on ofev for 3 or 4 years. Initially was in the high dosebut then developed GI discomfort and then reduce to the lower dose of 100 mg twice daily which he has continued without much of a problem except for on and off occasional nonspecific abdominal pain that is mild and transient. He said serial liver function with [place] that always been normal so far. He tells me in the last 6 months since I saw him he continues to have progressive worsening of shortness of breath. He needs 4 L with exertion but nothing at rest. He continues daily exercise and maintains significant activity at home. We discussed the possibility of going back up on the full dose given the decline. He is thinking about it. Your lung function test today and his FVC has declined further 3% compared to last visit. The entire profile as listed below   OV 10/15/2017   Chief Complaint  Patient presents with   Follow-up    Pt did increase the OFEV to the  and states he has had some occ. diarrhea and some GI upsets, which he did have with the lower dose of the OFEV, but other than that, he has had no real problems. Pt states breathing is about the  same. Pt states he has been able to tolerate the heat with his breathing but does have problems with hills.     Follow-up idiopathic pulmonary fibrosis  This visit is a visit after increasing his ofev from 100 mg twice daily to 150 mg twice daily. We did this because his lung function a decline somewhat in the past one year. He tells me that after increasing his dose that his diarrhea and GI discomfort is around the same. And otherwise this has not gotten worse. However it appears that he might be more fatigued. His wife feels it is because of ofev. He has no other reason. At this point in time he has resolved to continue with ful dose ofev and see what happens. He has oxygen for exertion and at night. He does have chronic cough which she says is variable in severity and it rates it as anywhere between 3-5 on a 10 point scale  K-BILD ILD QUESTIONNAIRE, Symptom score over prior 2 weeks  7-none, 6-rarely, 5-occ, 5-some times, 3-sev times, 2-most times, 1-every time 11/24/2014  04/24/2016  02/09/2017   Dyspnea for stairs, incline or hill Chest Tightness Worry about seriousness of lung complaint Avoided doing things that make you dyspneic Have you felt loss of control of lung condition (reversed from original) Felt fed up due to lung condition Felt urge to breathe aka air hunger 6 6 6  Has lung condition made you feel anxious How often have you experienced wheezing or whistling sound How much of the time have you felt your lung dz is getting worse How much has your lung condition interfered with job or daily task Were you expecting your lung condition to get worse How much has your lung function limited you carrying things like groceris How much has your lung function made you think of EOL? Total 78 68 71  Are you financially worse off Grand Total 84 72 75     OV 01/18/2018  Subjective:    Patient ID: Darrell Ware, male , DOB: 11/22/1926 , age 37 y.o. , MRN: [id number] , ADDRESS: [address] [place] [address]   01/18/2018 -   Chief Complaint  Patient presents with   Follow-up    tighness and cough with clear mucus, SOB on exertion (O2)     HPI Darrell Ware 85 y.o. -presents with his wife for IPF follow-up.  Overall he is stable.  He tells me that at home he gets his exercise by using nasal cannula oxygen at 5-6 L from the oxygen cylinder and he paces back-and-forth.  First 15 minutes is not a problem but when he gets to 1 hour it becomes a problem.  He uses portable imaging system when climbing hills and walking outdoors.  He feels this is insufficient at 2 L.  He feels is because of the pulse system.  He is now taking Internet 100 mg twice daily.  He does not want to do 150 mg twice daily.  This is because the higher dose gave him abdominal pains.  Most recent liver function test October 15, 2017 was fine.  He had pulmonary function test today and it is stable.  Personally visualized.  He and his wife plan to go to [place] in February 2019 for 1 month.  However he feels that he is getting too old and thinks that shutting down on him overall.  Therefore he is rethinking this very carefully.    OV 05/19/2018  Subjective:  Patient ID: Darrell Ware, male , DOB: 03/04/1926 , age 80 y.o. , MRN: [id number] , ADDRESS: [address] [place] [facility] [id number]   05/19/2018 -   Chief Complaint  Patient presents with   Follow-up    Pt stated while in [place] February 2020, he started having more problems with his breathing. States when he is doing activities, he has been having to use more O2. Pt states O2 has dropped to the 70-80s with little activities and also has had swelling in ankles.   Follow-up idiopathic pulmonary fibrosis on nintedanib  HPI Darrell Ware 85 y.o. -presents for follow-up.  Last seen November 2019.  In the interim I did see him at the support  group and he looked okay.  He tells me early February 2020 he went to [place] for his usuual winter trip and he returned the end of February 2020.  He says during this trip he started noticing a decline with shortness of breath.  And since then he said progressive decline with associated chest tightness and worsening cough.  He says going to the bathroom he desaturates to 74%.  However in the last few days he says he is beginning to feel better.  When we walked him he desaturated to 85%.  This is significantly worse than August 2019 as documented below.  There is no associated sputum or fever or infectious symptoms.  There is no associated worsening in pedal edema.  He is noted to have bilateral lower extremity varicose veins.  He feels he might have progressive ILD.  He feels the end might be near.  He is using oxygen at night and 4 L with exertion.  Of note he has a right infrascapular sebaceous cyst that is infected.  He wants to take cephalexin for this.  In the past this is helped.  He is asking for a prescription.    SYMPTOM SCALE - ILD 05/19/2018   O2 use Room air  Shortness of Breath 0 -> 5 scale with 5 being worst (score 6 If unable to do)  At rest 0  Simple tasks - showers, clothes change, eating, shaving 4  Household (dishes, doing bed, laundry) 4  Shopping x  Walking level at own pace 4  Walking keeping up with others of same age 54  Walking up Stairs 4.5  Walking up Hill 4.5  Total (40 - 48) Dyspnea Score 29  How bad is your cough? 3  How bad is your fatigue 3.5        Simple office walk 185 feet x  3 laps goal with forehead probe 10/15/2017  05/19/2018   O2 used Room air Room air  Number laps completed 3  1 laps only and desat  Comments about pace Normal pace Normal pace  Resting Pulse Ox/HR 100% and 68/min 97% and 79/min  Final Pulse Ox/HR 87% and 83/min 85% and 91/mn - at 1 lap  Desaturated </= 88% yes yes  Desaturated <= 3% points yes yes  Got Tachycardic >/= 90/min  no yes  Symptoms at end of test Moderate dyspnea moderate  Miscellaneous comments x WORSE     Results for FREMONT, SKALICKY (MRN [id number]) as of 08/21/2017 15:50  Ref. Range 08/30/2013 09:56 05/11/2014 09:54 09/29/2014 11:51 09/12/2015 10:46 04/22/2016 09:30 07/25/2016 11:19 02/09/2017 10:57 08/18/2017 15:57 01/18/2018   FVC-Pre Latest Units: L 3.89 3.76 3.80 3.49 3.44 3.37 3.25 3.08 3.29  FVC-%Pred-Pre Latest Units: % 89 87 88 82 81 80 77 74 79%    Results for MONTEY, EBEL (MRN [id number]) as of 08/21/2017 15:50  Ref. Range 08/30/2013 09:56 05/11/2014 09:54 09/29/2014 11:51 09/12/2015 10:46 04/22/2016 09:30 07/25/2016 11:19 02/09/2017 10:57 08/18/2017 15:57 01/18/2018   DLCO unc Latest Units: ml/min/mmHg 13.51 13.71 13.83 13.39  12.17 10.98 10.43 11.27  DLCO unc % pred Latest Units: % 37 37 38 36  33 30 28 30%      ROS - per HPI     has a past medical history of Atelectasis, Chronic bronchitis, Hemorrhoid, Inguinal hernia, Postural hypotension, and RBBB (2010).   reports that he quit smoking about 65 years ago. His smoking use included pipe and cigars. He has a 0.40 pack-year smoking history. He has never used smokeless tobacco.  Past Surgical History:  Procedure Laterality Date   CATARACT EXTRACTION     bilat   CORNEAL TRANSPLANT  1999   Right   left arm fracture  1950   RETINAL DETACHMENT SURGERY     Bilat   TONSILLECTOMY  1935   VASECTOMY  1972    Allergies  Allergen Reactions   Peanut-Containing Drug Products     Cough,wheezing - GENERAL PEANUT ALLERGY   Pirfenidone  Liver trouble   Cialis [Tadalafil] Rash    Immunization History  Administered Date(s) Administered   Influenza Split 11/25/2011, 01/02/2014   Influenza, High Dose Seasonal PF 11/25/2015, 01/09/2017, 12/29/2017   Influenza,inj,Quad PF,6+ Mos 11/24/2012, 11/25/2014   Pneumococcal-Unspecified 04/24/2012    Family History  Problem Relation Age of Onset   Heart failure Father    Heart  failure Mother    Macular degeneration Brother    Macular degeneration Sister      Current Outpatient Medications:    AZOPT 1 % ophthalmic suspension, Place 2 drops into the right eye 3 (three) times daily. , Disp: , Rfl:    Beta Sitosterol 40 % POWD, 1 tablet by Does not apply route 2 (two) times daily., Disp: , Rfl:    Cyanocobalamin (B-12) 2500 MCG SUBL, Place under the tongue daily., Disp: , Rfl:    Flaxseed, Linseed, (FLAX SEED OIL PO), Take by mouth daily., Disp: , Rfl:    LOTEMAX 0.5 % GEL, Place 1 drop into the right eye 2 (two) times daily. , Disp: , Rfl:    NON FORMULARY, Shaklee supplement twice daily, Disp: , Rfl:    OFEV 100 MG CAPS, TAKE 1 CAPSULE BY MOUTH TWICE DAILY WITH FOOD., Disp: 60 capsule, Rfl: 4   ZIOPTAN 0.0015 % SOLN, Place 1 drop into the right eye daily., Disp: , Rfl:    cephALEXin (KEFLEX) 500 MG capsule, Take 1 capsule (500 mg total) by mouth 3 (three) times daily., Disp: 15 capsule, Rfl: 0   fluticasone (FLONASE) 50 MCG/ACT nasal spray, Place 2 sprays into the nose as needed. , Disp: , Rfl:    predniSONE (DELTASONE) 10 MG tablet, Take 4tabsx3days,3tabsx3days,2tabsx3days,1tabx3days,then stop, Disp: 30 tablet, Rfl: 0   tamsulosin (FLOMAX) 0.4 MG CAPS capsule, Take 0.4 mg by mouth daily., Disp: , Rfl:       Objective:   Vitals:   05/19/18 1121  BP: 120/70  Pulse: 75  SpO2: 96%  Weight: 83.8 kg  Height: 6\' 3"  (1.905 m)    Estimated body mass index is 23.1 kg/m as calculated from the following:   Height as of this encounter: 6\' 3"  (1.905 m).   Weight as of this encounter: 83.8 kg.  @WEIGHTCHANGE @  Filed Weights   05/19/18 1121  Weight: 83.8 kg     Physical Exam  General Appearance:    Alert, cooperative, no distress, appears stated age - yes , Deconditioned looking - no , OBESE  - no, Sitting on Wheelchair -  no  Head:    Normocephalic, without obvious abnormality, atraumatic  Eyes:    PERRL, conjunctiva/corneas clear,  Ears:     Normal TM's and external ear canals, both ears  Nose:   Nares normal, septum midline, mucosa normal, no drainage    or sinus tenderness. OXYGEN ON  - no . Patient is @ ra at rest   Throat:   Lips, mucosa, and tongue normal; teeth and gums normal. Cyanosis on lips - no  Neck:   Supple, symmetrical, trachea midline, no adenopathy;    thyroid:  no enlargement/tenderness/nodules; no carotid   bruit or JVD  Back:     Symmetric, no curvature, ROM normal, no CVA tenderness  Lungs:     Distress - no , Wheeze no, Barrell Chest - no, Purse lip breathing - no, Crackles - Yes velcro at lung base . DYSPNEIC talking +  Chest Wall:    No tenderness or deformity.  In the right infrascapular area in the  back there is a ruptured sebaceous cyst that I witnessed.   Heart:    Regular rate and rhythm, S1 and S2 normal, no rub   or gallop, Murmur - no  Breast Exam:    NOT DONE  Abdomen:     Soft, non-tender, bowel sounds active all four quadrants,    no masses, no organomegaly. Visceral obesity - no  Genitalia:   NOT DONE  Rectal:   NOT DONE  Extremities:   Extremities - normal, Has Cane - no, Clubbing - no, Edema - no. VARICOSISITIES chronic +   Pulses:   2+ and symmetric all extremities  Skin:   Stigmata of Connective Tissue Disease - no  Lymph nodes:   Cervical, supraclavicular, and axillary nodes normal  Psychiatric:  Neurologic:   Pleasant - yes, Anxious - no, Flat affect - no  CAm-ICU - neg, Alert and Oriented x 3 - yes, Moves all 4s - yes, Speech - normal, Cognition - intact           Assessment:       ICD-10-CM   1. Idiopathic pulmonary fibrosis (HCC) J84.112 CBC w/Diff    Basic Metabolic Panel (BMET)    Hepatic function panel  2. Therapeutic drug monitoring Z51.81 CBC w/Diff    Basic Metabolic Panel (BMET)    Hepatic function panel  3. Infected sebaceous cyst L72.3 CBC w/Diff   L08.9 Basic Metabolic Panel (BMET)    Hepatic function panel       Plan:     Patient Instructions    Idiopathic pulmonary fibrosis (HCC) Therapeutic drug monitoring  - very concerned your fibrosis worse - continue ofev - continue o2 with minimal exertion and night o2 - d/w Mrs Barcelo and consider home hospice early (once covid hits their services are going to be streteched( - check cbc, bmet, lft - consider taking - Please take Take prednisone 40mg  once daily x 3 days, then 30mg  once daily x 3 days, then 20mg  once daily x 3 days, then prednisone 10mg  once daily  x 3 days and stop   Infected sebaceous cyst  - cephalexin 500mg  three times daily x  5 days   Followup 3 months or sooner if needed Call us with any questions Hunker down for pandemic      SIGNATURE    [name], M.D., F.C.C.P,  Pulmonary and Critical Care Medicine Staff Physician, [facility] Director - Interstitial Lung Disease  Program  Pulmonary Fibrosis [facility] at [facility] [place], [place], [address]  Pager: [phone], If no answer or between  15:00h - 7:00h: call [phone] Telephone: [phone]  3:26 PM 05/19/2018

## 2018-05-19 NOTE — Telephone Encounter (Signed)
Yes tht is fine

## 2018-05-19 NOTE — Telephone Encounter (Signed)
Called and spoke with [facility].  Dr Marchelle Gearing attending for hospice care, verbal given. Understanding stated.  Nothing further at this time.

## 2018-05-19 NOTE — Telephone Encounter (Signed)
Patient is allergic to Cephalexin and is needing something else called in.   MR please advise, thank you.

## 2018-05-19 NOTE — Addendum Note (Signed)
Addended by: Demetrio Lapping E on: 05/19/2018 03:34 PM   Modules accepted: Orders

## 2018-05-19 NOTE — Telephone Encounter (Signed)
Called patient, unable to reach [facility]. Called and spoke with patients pharmacy Almira Coaster, when speaking with Almira Coaster she stated that the patient called them and stated to them that it was a mistake and he can take it. Medication was dispensed. Nothing further needed.

## 2018-05-19 NOTE — Telephone Encounter (Signed)
  He specifically asked for cephalexin and is not listed in allergies. Who is saying he is allergic to cephalexin  Allergies  Allergen Reactions  . Peanut-Containing Drug Products     Cough,wheezing - GENERAL PEANUT ALLERGY  . Pirfenidone     Liver trouble  . Cialis [Tadalafil] Rash

## 2018-05-20 ENCOUNTER — Telehealth: Payer: Self-pay | Admitting: Internal Medicine

## 2018-05-20 NOTE — Telephone Encounter (Signed)
Notes recorded by Kalman Shan, MD on 05/19/2018 at 5:50 PM EDT Labs normal  Spoke with pt and notified of results per Dr. Marchelle Gearing. Pt verbalized understanding and denied any questions.

## 2018-05-20 NOTE — Progress Notes (Signed)
Spoke with pt and notified of results per Dr. Ramaswamy. Pt verbalized understanding and denied any questions.  

## 2018-05-21 ENCOUNTER — Telehealth: Payer: Self-pay | Admitting: Internal Medicine

## 2018-05-21 DIAGNOSIS — J84112 Idiopathic pulmonary fibrosis: Secondary | ICD-10-CM

## 2018-05-21 NOTE — Telephone Encounter (Signed)
Yes will be attending  °

## 2018-05-21 NOTE — Telephone Encounter (Signed)
MR, I know you had discussed this with pt at last OV with you. Please advise if you are willing to be pt's attending for the palliative care. Thanks!

## 2018-05-21 NOTE — Telephone Encounter (Signed)
Called [facility] and spoke with Amy to see if they needed an order or a verbal could be given in regards to pt and family wanting to go through with Palliative care and per Amy, they would need an order to be placed and sent to them. Order has been placed for palliative care with [facility] having MR as the attending. Nothing further needed.

## 2018-06-03 ENCOUNTER — Telehealth: Payer: Self-pay

## 2018-06-03 NOTE — Telephone Encounter (Signed)
Patient contacted for Palliative Care visit.  Due to the current COVID-19 infection/crises, the patient and family prefer, and have given their verbal consent for, a provider visit via telemedicine. HIPPA policies of confidentially were discussed and patient/family expressed understanding.  Visit scheduled for 06/04/18 @ 11:00am

## 2018-06-04 ENCOUNTER — Encounter: Payer: Self-pay | Admitting: Internal Medicine

## 2018-06-04 ENCOUNTER — Other Ambulatory Visit: Payer: Medicare Other | Admitting: Internal Medicine

## 2018-06-04 ENCOUNTER — Other Ambulatory Visit: Payer: Self-pay

## 2018-06-04 DIAGNOSIS — Z515 Encounter for palliative care: Secondary | ICD-10-CM

## 2018-06-04 NOTE — Progress Notes (Signed)
May 10th, 2020 [facility] Palliative Care Consult Note Telephone: [phone]  Fax: [phone]  Due to the current COVID-19 infection/crises, the patient and family prefer, and have given their verbal consent for, a provider visit via telemedicine.  PATIENT NAME: Darrell Ware DOB: 29-Apr-1926 MRN: [id number]  PRIMARY CARE PROVIDER:   Merlene Laughter, MD  REFERRING PROVIDER:  Merlene Laughter, MD [address] [place], [place] [address]  RESPONSIBLE PARTY:     ASSESSMENT:     1. Functional decline r/t progressive pulmonary fibrosis: Over last 3 months notes decreased exercise tolerance so that now home bound as experiences significant dyspnea ambulating very short distances when utilizing carry along pulse compressor (even when dialed up to max 6 LPM). He does better with continuous flow 2-4 LPM O2 within the home, though distance limited to 30 min or so d/t dyspnea and increase in HR. Over the last year he requires nocturnal O2 at 2 LPM. He took a recent trip to [place] (his usual annual Feb trip) and this time was short of breath even at rest and on oxygen (new). At rest and on low flow oxygen he can easily converse and maintain sats 94-96%, but desaturates to mid 80's with ambulation. Independent in hygiene, dressing, transfers, ambulation (without assistive devices). Get exhausted and dyspneic with showers. He has a large oxygen tank in the home for emergency use, should the power go out, and knows could use portable pulse oximeter hooked into car charger.  -discussed pulmonary rehab. He has participated in this in the past but doubts he is a candidate anymore due to stage of his disease. In any event, the program is not currently in operation d/t COVID-19 crisis. Will address in future  2. 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 Patient speaks openly and frankly about his disease, and expected progressive course. He is actively involved in a pulmonary fibrosis support group. Feels he is emotionally doing okay; mentions past bout with depression which he feel is to be expected by anyone with a terminal disease. Currently, feels he is in a good emotional state. His primary huge source of  support is his wife (former Teacher, early years/pre), his daughter who works as a TEFL teacher within the [employer], and his church family (increasingly important to him). Patient states he does have some emotionally down days, but has learned to be patient with himself, and that these episodes pass with utilizing his supports. Laments giving up favorite activities (biking/hiking on trails) and trips (annual to [place]). Patient anticipates a peaceful passing. Envisions as a non-painful process. He feels that he will be ready to pass, when the time comes, as he won't be experiencing significant quality of life. We discussed hospice and he is open to this as he declines. Patient and his wife live in a single level retirement type of community ([facility] [place]) and have cultivated good friendships with the neighbors.   -Normalize feelings; encourage ongoing expression of. Setting new goals, with plans and hopes for achieving those.  -Discussed use of continuous portable tanks for his outdoor walks; might improve his endurance/satisfy oxygen requirements over the current use of the portable pulse devise. He will think about this.  3. Advanced Care Directives: We didn't address today.   -Will broach next visit.  4. F/U: will reach  out in early May, to schedule f/u home or telehealth visit.  I spent 60 minutes providing this consultation,  from 11 am to 12pm. More than 50% of the time in this consultation was spent coordinating communication.   HISTORY OF PRESENT ILLNESS:  Darrell Ware is  an 85 y.o. year old male with late stage Pulmonary Fibrosis. Palliative Care was asked to help address goals of care.   CODE STATUS:   PPS: 60% HOSPICE ELIGIBILITY/DIAGNOSIS: TBD  PAST MEDICAL HISTORY:  Past Medical History:  Diagnosis Date  . Atelectasis   . Chronic bronchitis   . Hemorrhoid   . Inguinal hernia    Left  . Postural hypotension   . RBBB 2010    SOCIAL HX:  Social History   Tobacco Use  . Smoking status: Former Smoker    Packs/day: 0.10    Years: 4.00    Pack years: 0.40    Types: Pipe, Cigars    Last attempt to quit: 02/24/1953    Years since quitting: 65.3  . Smokeless tobacco: Never Used  . Tobacco comment: only smoked an occ pipe/cigar x 3-4 years  Substance Use Topics  . Alcohol use: Yes    Alcohol/week: 7.0 standard drinks    Types: 7 Glasses of wine per week    ALLERGIES:  Allergies  Allergen Reactions  . Peanut-Containing Drug Products     Cough,wheezing - GENERAL PEANUT ALLERGY  . Pirfenidone     Liver trouble  . Cialis [Tadalafil] Rash     PERTINENT MEDICATIONS:  Outpatient Encounter Medications as of 06/04/2018  Medication Sig  . AZOPT 1 % ophthalmic suspension Place 2 drops into the right eye 3 (three) times daily.   . Beta Sitosterol 40 % POWD 1 tablet by Does not apply route 2 (two) times daily.  . cephALEXin (KEFLEX) 500 MG capsule Take 1 capsule (500 mg total) by mouth 3 (three) times daily.  . Cyanocobalamin (B-12) 2500 MCG SUBL Place under the tongue daily.  . Flaxseed, Linseed, (FLAX SEED OIL PO) Take by mouth daily.  . fluticasone (FLONASE) 50 MCG/ACT nasal spray Place 2 sprays into the nose as needed.   Marland Kitchen. LOTEMAX 0.5 % GEL Place 1 drop into the right eye 2 (two) times daily.   . NON FORMULARY Shaklee supplement twice daily  . OFEV 100 MG CAPS TAKE 1 CAPSULE BY MOUTH TWICE DAILY WITH FOOD.  Marland Kitchen. predniSONE (DELTASONE) 10 MG tablet Take 4tabsx3days,3tabsx3days,2tabsx3days,1tabx3days,then stop  . tamsulosin (FLOMAX) 0.4 MG CAPS  capsule Take 0.4 mg by mouth daily.  Marland Kitchen. ZIOPTAN 0.0015 % SOLN Place 1 drop into the right eye daily.   No facility-administered encounter medications on file as of 06/04/2018.     PHYSICAL EXAM:   Very pleasantly conversant, slender, older gentleman conversant on 2 L McBride. His wife is in attendance. Seated; no WOB with conversation. Extremities: no edema, no joint deformities Skin: no rashes exposed skin Neurological: nonfocal  Anselm LisMary P Quill Grinder, NP

## 2018-06-10 ENCOUNTER — Encounter: Payer: Self-pay | Admitting: Internal Medicine

## 2018-06-22 ENCOUNTER — Ambulatory Visit (INDEPENDENT_AMBULATORY_CARE_PROVIDER_SITE_OTHER): Payer: Medicare Other | Admitting: Pulmonary Disease

## 2018-06-22 ENCOUNTER — Encounter: Payer: Self-pay | Admitting: Pulmonary Disease

## 2018-06-22 ENCOUNTER — Other Ambulatory Visit: Payer: Self-pay

## 2018-06-22 DIAGNOSIS — J84112 Idiopathic pulmonary fibrosis: Secondary | ICD-10-CM

## 2018-06-22 NOTE — Progress Notes (Signed)
Virtual Visit via Telephone Note  I connected with Darrell Ware on 06/22/18 at 11:30 AM EDT by telephone and verified that I am speaking with the correct person using two identifiers.   I discussed the limitations, risks, security and privacy concerns of performing an evaluation and management service by telephone and the availability of in person appointments. I also discussed with the patient that there may be a patient responsible charge related to this service. The patient expressed understanding and agreed to proceed.   History of Present Illness: 85 year old former smoker followed in our office for IPF  Dr. Chase Ware  Patient consented to consult via telephone: Yes People present and their role in pt care: Pt   Chief complaint: IPF   85 year old male former smoker followed in our office for IPF he is followed by Dr. Chase Ware.  Patient reports that he is significantly improved since his March/2020 office visit.  Patient reports he has improved shortness of breath, little to no wheezing and a decreased cough.  Patient continues to be maintained on OFEV.  He has occasional diarrhea from the [place].  Patient has also met with the hospice RN and they elected to put the patient on palliative care.  Patient has had a palliative care appointment already.    Patient never started prednisone taper after last office visit.  He is wondering if he should start it anyway.  Patient is cautious as he does not want to take steroids if he does not have to.    Observations/Objective:  Lab Results  Component Value Date   NITRICOXIDE 39 07/25/2016    Assessment and Plan:  Idiopathic pulmonary fibrosis Assessment: Known IPF Currently on 0FEV  Plan: Continue OFEV Okay to not start prednisone taper 6-week telephonic follow-up with Darrell Quaker, FNP   Follow Up Instructions:  Return in about 6 weeks (around 08/03/2018), or if symptoms worsen or fail to improve, for Follow up with Dr.  Purnell Ware, Follow up with Darrell Quaker FNP-C.    I discussed the assessment and treatment plan with the patient. The patient was provided an opportunity to ask questions and all were answered. The patient agreed with the plan and demonstrated an understanding of the instructions.   The patient was advised to call back or seek an in-person evaluation if the symptoms worsen or if the condition fails to improve as anticipated.  I provided 24 minutes of non-face-to-face time during this encounter.   Lauraine Rinne, NP

## 2018-06-22 NOTE — Patient Instructions (Addendum)
Continue OFEV   Do not start Prednisone taper    Continue follow up with Pallative Care   Return in about 6 weeks (around 08/03/2018), or if symptoms worsen or fail to improve, for Follow up with Dr. Dalbert Mayotte, Follow up with Elisha Headland FNP-C.   Coronavirus (COVID-19) Are you at risk?  Are you at risk for the Coronavirus (COVID-19)?  To be considered HIGH RISK for Coronavirus (COVID-19), you have to meet the following criteria:  . Traveled to [place], [place], [place], [place] or [place]; or in the [place] to [place], [place], [place], or [place]; and have fever, cough, and shortness of breath within the last 2 weeks of travel OR . Been in close contact with a person diagnosed with COVID-19 within the last 2 weeks and have fever, cough, and shortness of breath . IF YOU DO NOT MEET THESE CRITERIA, YOU ARE CONSIDERED LOW RISK FOR COVID-19.  What to do if you are HIGH RISK for COVID-19?  Marland Kitchen If you are having a medical emergency, call 911. . Seek medical care right away. Before you go to a doctor's office, urgent care or emergency department, call ahead and tell them about your recent travel, contact with someone diagnosed with COVID-19, and your symptoms. You should receive instructions from your physician's office regarding next steps of care.  . When you arrive at healthcare provider, tell the healthcare staff immediately you have returned from visiting [place], [place], [place], [place] or [place]; or traveled in the [place] to [place], [place], [place], or [place]; in the last two weeks or you have been in close contact with a person diagnosed with COVID-19 in the last 2 weeks.   . Tell the health care staff about your symptoms: fever, cough and shortness of breath. . After you have been seen by a medical provider, you will be either: o Tested for (COVID-19) and discharged home on quarantine except to seek medical care if symptoms worsen, and asked to  - Stay home and  avoid contact with others until you get your results (4-5 days)  - Avoid travel on public transportation if possible (such as bus, train, or airplane) or o Sent to the Emergency Department by EMS for evaluation, COVID-19 testing, and possible admission depending on your condition and test results.  What to do if you are LOW RISK for COVID-19?  Reduce your risk of any infection by using the same precautions used for avoiding the common cold or flu:  Marland Kitchen Wash your hands often with soap and warm water for at least 20 seconds.  If soap and water are not readily available, use an alcohol-based hand sanitizer with at least 60% alcohol.  . If coughing or sneezing, cover your mouth and nose by coughing or sneezing into the elbow areas of your shirt or coat, into a tissue or into your sleeve (not your hands). . Avoid shaking hands with others and consider head nods or verbal greetings only. . Avoid touching your eyes, nose, or mouth with unwashed hands.  . Avoid close contact with people who are sick. . Avoid places or events with large numbers of people in one location, like concerts or sporting events. . Carefully consider travel plans you have or are making. . If you are planning any travel outside or inside the [place], visit the CDC's Travelers' Health webpage for the latest health notices. . If you have some symptoms but not all symptoms, continue to monitor at  home and seek medical attention if your symptoms worsen. . If you are having a medical emergency, call 911.   ADDITIONAL HEALTHCARE OPTIONS FOR PATIENTS  [facility] / e-Visit: [email or web address]         [facility]: [phone]  [facility]: [phone]                   [facility]: [phone]           It is flu season:   >>> Best ways to protect herself from the flu: Receive the yearly flu vaccine, practice good hand hygiene washing  with soap and also using hand sanitizer when available, eat a nutritious meals, get adequate rest, hydrate appropriately   Please contact the office if your symptoms worsen or you have concerns that you are not improving.   Thank you for choosing [facility] for your healthcare, and for allowing us to partner with you on your healthcare journey. I am thankful to be able to provide care to you today.   Elisha HeadlandBrian Mack FNP-C

## 2018-06-22 NOTE — Assessment & Plan Note (Signed)
Assessment: Known IPF Currently on 0FEV  Plan: Continue OFEV Okay to not start prednisone taper 6-week telephonic follow-up with Elisha Headland, FNP

## 2018-07-01 ENCOUNTER — Telehealth: Payer: Self-pay | Admitting: Internal Medicine

## 2018-07-01 NOTE — Telephone Encounter (Signed)
3:44pm TC to spouse Vernona Rieger, in f/u form Palliative Care visit 06/04/18; checking if wish to address MOST form/advance care planning. Vernona Rieger stated patient was doing very well; did indeed wish to discuss and complete MOST. She mentioned they had the forms in hand, but hadn't had a chance to discuss. Requested that I call back in 2 weeks to schedule tele-health visit or home visit, pnd lifting of COVID-19 restrictions.  Holly Bodily NP-C [phone]

## 2018-07-27 ENCOUNTER — Telehealth: Payer: Self-pay | Admitting: Internal Medicine

## 2018-07-27 ENCOUNTER — Encounter: Payer: Self-pay | Admitting: Internal Medicine

## 2018-07-27 ENCOUNTER — Other Ambulatory Visit: Payer: Medicare Other | Admitting: Internal Medicine

## 2018-07-27 ENCOUNTER — Other Ambulatory Visit: Payer: Self-pay

## 2018-07-27 DIAGNOSIS — Z515 Encounter for palliative care: Secondary | ICD-10-CM

## 2018-07-27 NOTE — Telephone Encounter (Signed)
3:30pm: TC to patient to set up telehealth visit. Patient requests I call the week of June 14th to schedule. Holly Bodily NP-C [phone]

## 2018-07-27 NOTE — Progress Notes (Signed)
June 1st, 2020 [facility] Palliative Care Consult Note Telephone: [phone]  Fax: [phone]  Due to the current COVID-19 infection/crises, the family prefer, and have given their verbal consent for, a provider visit via telemedicine. HIPPA policies of confidentially were discussed. Video-audio (telehealth) contact was unable to be done due technical barriers from the patient's side.  PATIENT NAME: Darrell Ware DOB: Dec 10, 1926 MRN: [id number]  PRIMARY CARE PROVIDER:   Merlene Laughter, MD  REFERRING PROVIDER:  Merlene Laughter, MD [address] [place], [place] [address]   RESPONSIBLE PARTY:   Spouse Vernona Rieger [phone], [phone]    RECOMMENDATIONS and PLAN:  1. Advance Care Planning: touched base with patient in follow up. Patient mentioned that in the event of a cardio-pulmonary arrest, he would not wish any heroic measures. However, he wished to review further the patient education materials I had send on this topic, with his wife, before further discussion.  2. Goals of Care: to improve his endurance and to get out onto the walking trails. His IPF limits his capacity to ambulate on the trails he enjoyed. He's considering obtaining a motorized scooter to expand his horizons.  -I will mail patient low impact LE exercises; I discussed these in brief over the phone and encouraged him to build up to 10 of each of the five exercises, 3 times daily.  3. Follow up: will call week of June 21st to review and hopefully complete MOST form.  I spent 30 minutes providing this consultation,  from 3:30 pm-4 pm. More than 50% of the time in this consultation was spent coordinating communication.   HISTORY OF PRESENT ILLNESS:  Darrell Ware is an 85 y.o.male with late stage Pulmonary Fibrosis. Palliative Care was asked to help address goals of care.   CODE STATUS:   PPS: 60% HOSPICE ELIGIBILITY/DIAGNOSIS: TBD  PAST MEDICAL HISTORY:   Past Medical History:  Diagnosis Date  . Atelectasis   . Chronic bronchitis   . Hemorrhoid   . Inguinal hernia    Left  . Postural hypotension   . RBBB 2010    SOCIAL HX:  Social History   Tobacco Use  . Smoking status: Former Smoker    Packs/day: 0.10    Years: 4.00    Pack years: 0.40    Types: Pipe, Cigars    Last attempt to quit: 02/24/1953    Years since quitting: 65.4  . Smokeless tobacco: Never Used  . Tobacco comment: only smoked an occ pipe/cigar x 3-4 years  Substance Use Topics  . Alcohol use: Yes    Alcohol/week: 7.0 standard drinks    Types: 7 Glasses of wine per week    ALLERGIES:  Allergies  Allergen Reactions  . Peanut-Containing Drug Products     Cough,wheezing - GENERAL PEANUT ALLERGY  . Pirfenidone     Liver trouble  . Cialis [Tadalafil] Rash     PERTINENT MEDICATIONS:  Outpatient Encounter Medications as of 07/27/2018  Medication Sig  . AZOPT 1 % ophthalmic suspension Place 2 drops into the right eye 3 (three) times daily.   . Beta Sitosterol 40 % POWD 1 tablet by Does not apply route 2 (two) times daily.  . cephALEXin (KEFLEX) 500 MG capsule Take 1 capsule (500 mg total) by mouth 3 (three) times daily.  . Cyanocobalamin (B-12) 2500 MCG SUBL Place under the tongue daily.  . Flaxseed, Linseed, (FLAX SEED OIL PO) Take by mouth daily.  Marland Kitchen  fluticasone (FLONASE) 50 MCG/ACT nasal spray Place 2 sprays into the nose as needed.   Marland Kitchen. LOTEMAX 0.5 % GEL Place 1 drop into the right eye 2 (two) times daily.   . NON FORMULARY Shaklee supplement twice daily  . OFEV 100 MG CAPS TAKE 1 CAPSULE BY MOUTH TWICE DAILY WITH FOOD.  Marland Kitchen. predniSONE (DELTASONE) 10 MG tablet Take 4tabsx3days,3tabsx3days,2tabsx3days,1tabx3days,then stop  . tamsulosin (FLOMAX) 0.4 MG CAPS capsule Take 0.4 mg by mouth daily.  Marland Kitchen. ZIOPTAN 0.0015 % SOLN Place 1 drop into the right eye daily.   No facility-administered encounter medications on file as of 07/27/2018.     PHYSICAL EXAM:   PE deferred  d/t audio only nature of visit.  Anselm LisMary P Jenelle Drennon, NP

## 2018-08-02 NOTE — Progress Notes (Signed)
Virtual Visit via Telephone Note  I connected with Darrell Ware on 08/03/18 at 11:30 AM EDT by telephone and verified that I am speaking with the correct person using two identifiers.  Location: Patient: Home Provider: [facility] - [address], [place], [place], [place] [address]   I discussed the limitations, risks, security and privacy concerns of performing an evaluation and management service by telephone and the availability of in person appointments. I also discussed with the patient that there may be a patient responsible charge related to this service. The patient expressed understanding and agreed to proceed.  Patient consented to consult via telephone: Yes People present and their role in pt care: Pt   History of Present Illness: 85 year old former smoker followed in our office for IPF  Smoking history: Former smoker Maintenance: OFEV Dr. Chase Caller  Chief complaint: IPF - 6 week follow up    85 year old male former smoker followed in our office for IPF.  He is managed on Ofev 100 mg twice daily.  Patient is completing a 6-week follow-up with our office today and reports that clinically he has been stable.  He is noticed no changes with his breathing.  He continues to use oxygen with exertion.  He says he has occasional bouts of runny stools but nothing out of the ordinary and this is his baseline since being on anti-fibrotic's.  Overall patient feels quite well.  He has not had any more episodes similar to his episode that he had in March/2020.  Observations/Objective:  05/26/2011-CT chest without contrast-bilateral peripheral basilar predominant interstitial reticulation and mild subpleural honeycombing is identified suspicious for pulmonary fibrosis, prior granulomatous disease, noncalcified nodule within the right upper lobe measures 6.3 mm the patient is high risk for bronchogenic carcinoma follow-up CT in 6 to 12 months is  recommended  01/18/2018-pulmonary function test-FVC 3.29 (79% predicted), ratio 79, FEV1 2.61 (90% predicted), DLCO 30  Assessment and Plan:  Idiopathic pulmonary fibrosis Assessment:  Known IPF  Currently on OFEV Pt feels clinically stable  Plan:  Continue OFEV  Continue follow up with pallative care  3 month follow up with Dr. Chase Caller with Spirometry with DLCO   Chronic respiratory failure Plan:  Continue using oxygen with exertion    Follow Up Instructions:  Return in about 3 months (around 11/03/2018), or if symptoms worsen or fail to improve, for Follow up for PFT, Follow up with [name] FNP-C, Follow up with Dr. Purnell Shoemaker.   I discussed the assessment and treatment plan with the patient. The patient was provided an opportunity to ask questions and all were answered. The patient agreed with the plan and demonstrated an understanding of the instructions.   The patient was advised to call back or seek an in-person evaluation if the symptoms worsen or if the condition fails to improve as anticipated.  I provided 26 minutes of non-face-to-face time during this encounter.   Lauraine Rinne, NP

## 2018-08-03 ENCOUNTER — Other Ambulatory Visit: Payer: Self-pay

## 2018-08-03 ENCOUNTER — Ambulatory Visit (INDEPENDENT_AMBULATORY_CARE_PROVIDER_SITE_OTHER): Payer: Medicare Other | Admitting: Pulmonary Disease

## 2018-08-03 ENCOUNTER — Encounter: Payer: Self-pay | Admitting: Pulmonary Disease

## 2018-08-03 DIAGNOSIS — J84112 Idiopathic pulmonary fibrosis: Secondary | ICD-10-CM | POA: Diagnosis not present

## 2018-08-03 DIAGNOSIS — J9611 Chronic respiratory failure with hypoxia: Secondary | ICD-10-CM

## 2018-08-03 NOTE — Patient Instructions (Signed)
Continue OFEV   Can use imodium as needed for diarrhea     Continue follow up with Pallative Care    Return in about 3 months (around 11/03/2018), or if symptoms worsen or fail to improve, for Follow up for PFT, Follow up with [name] FNP-C, Follow up with Dr. Purnell Shoemaker.   Coronavirus (COVID-19) Are you at risk?  Are you at risk for the Coronavirus (COVID-19)?  To be considered HIGH RISK for Coronavirus (COVID-19), you have to meet the following criteria:  . Traveled to [place], [place], [place], [place] or [place]; or in the [place] to [place], [place], [place], or [place]; and have fever, cough, and shortness of breath within the last 2 weeks of travel OR . Been in close contact with a person diagnosed with COVID-19 within the last 2 weeks and have fever, cough, and shortness of breath . IF YOU DO NOT MEET THESE CRITERIA, YOU ARE CONSIDERED LOW RISK FOR COVID-19.  What to do if you are HIGH RISK for COVID-19?  Marland Kitchen If you are having a medical emergency, call 911. . Seek medical care right away. Before you go to a doctor's office, urgent care or emergency department, call ahead and tell them about your recent travel, contact with someone diagnosed with COVID-19, and your symptoms. You should receive instructions from your physician's office regarding next steps of care.  . When you arrive at healthcare provider, tell the healthcare staff immediately you have returned from visiting [place], [place], [place], [place] or [place]; or traveled in the [place] to [place], [place], [place], or [place]; in the last two weeks or you have been in close contact with a person diagnosed with COVID-19 in the last 2 weeks.   . Tell the health care staff about your symptoms: fever, cough and shortness of breath. . After you have been seen by a medical provider, you will be either: o Tested for (COVID-19) and discharged home on quarantine except to seek medical care if symptoms  worsen, and asked to  - Stay home and avoid contact with others until you get your results (4-5 days)  - Avoid travel on public transportation if possible (such as bus, train, or airplane) or o Sent to the Emergency Department by EMS for evaluation, COVID-19 testing, and possible admission depending on your condition and test results.  What to do if you are LOW RISK for COVID-19?  Reduce your risk of any infection by using the same precautions used for avoiding the common cold or flu:  Marland Kitchen Wash your hands often with soap and warm water for at least 20 seconds.  If soap and water are not readily available, use an alcohol-based hand sanitizer with at least 60% alcohol.  . If coughing or sneezing, cover your mouth and nose by coughing or sneezing into the elbow areas of your shirt or coat, into a tissue or into your sleeve (not your hands). . Avoid shaking hands with others and consider head nods or verbal greetings only. . Avoid touching your eyes, nose, or mouth with unwashed hands.  . Avoid close contact with people who are sick. . Avoid places or events with large numbers of people in one location, like concerts or sporting events. . Carefully consider travel plans you have or are making. . If you are planning any travel outside or inside the [place], visit the CDC's Travelers' Health webpage for the latest health notices. . If you have some symptoms  but not all symptoms, continue to monitor at home and seek medical attention if your symptoms worsen. . If you are having a medical emergency, call 911.   ADDITIONAL HEALTHCARE OPTIONS FOR PATIENTS  [facility] / e-Visit: [email or web address]         [facility]: [phone]  [facility]: [phone]                   [facility]: [phone]           It is flu season:   >>> Best ways to protect herself from the flu: Receive the yearly flu  vaccine, practice good hand hygiene washing with soap and also using hand sanitizer when available, eat a nutritious meals, get adequate rest, hydrate appropriately   Please contact the office if your symptoms worsen or you have concerns that you are not improving.   Thank you for choosing [facility] for your healthcare, and for allowing us to partner with you on your healthcare journey. I am thankful to be able to provide care to you today.   Elisha HeadlandBrian  FNP-C

## 2018-08-03 NOTE — Assessment & Plan Note (Signed)
Plan:  Continue using oxygen with exertion

## 2018-08-03 NOTE — Assessment & Plan Note (Signed)
Assessment:  Known IPF  Currently on OFEV Pt feels clinically stable  Plan:  Continue OFEV  Continue follow up with pallative care  3 month follow up with Dr. Chase Caller with Spirometry with DLCO

## 2018-08-19 ENCOUNTER — Encounter: Payer: Self-pay | Admitting: Internal Medicine

## 2018-08-19 ENCOUNTER — Other Ambulatory Visit: Payer: Medicare Other | Admitting: Internal Medicine

## 2018-08-19 DIAGNOSIS — Z515 Encounter for palliative care: Secondary | ICD-10-CM

## 2018-08-19 NOTE — Progress Notes (Signed)
June 25th, 2020 [facility] Palliative Care Consult Note Telephone: [phone]  Fax: [phone]   Due to the current COVID-19 infection/crises, the family prefer, and have given their verbal consent for, a provider visit via telemedicine. HIPPA policies of confidentially were discussed. Video-audio (telehealth) contact was unable to be done due technical barriers from the patient's side.   PATIENT NAME: Darrell Ware DOB: 11-26-1926 MRN: [id number]   PRIMARY CARE PROVIDER:   Lajean Manes, MD   REFERRING PROVIDER:  Lajean Manes, MD [address] [place], [place] [address]    RESPONSIBLE PARTY:   Spouse Darrell Ware [phone], [phone] .    RECOMMENDATIONS and PLAN:  1.Advance Care Planning: I discussed benefits vs burdens of resuscitation in the event of a cardio-pulmonary arrest, in the setting of patients age and end stage lung disease. Patient stated that he would wish to be a DNR. I completed two DNR forms, up-loaded them into the [facility]) system, and mailed two copies to the patient. I advised him to place one of the forms on the fridge, and carry the other with him when he travels outside the home. I advised him to share this decision with his family members.  Patient is interested in completing the MOST form, but wishes to touch base with his pulmonary team to ask about possibility of being able to get off of a ventilator, should he need. He is okay with option of BiPAP or CPAP is need be. I emailed him another patient education packet on the MOST; he couldn't locate the last one sent.    2. Goals of Care:  To improve his endurance and to get out onto the walking trails.  He purchased a little motorized scooter, and now enjoys more freedom to wander. He is exercising on a regular basis He is enjoying riding about in his [employer].  His disease is stable, per his last pulmonary visit (sees that team  monthly).  3. Supportive counseling setting of serious illness: We discussed roller coaster ride of periodic need for intensive interventions when he has episodes of crises. 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 This makes it difficult to read the technical magazines that he loves, and to tinker with mechanical repairs. He also mentions the burden of not being able to walk about without struggling for breath. The cumulative effects decrease his quality of life. He speaks openly of this. Otherwise is upbeat and handles his problems with humor.   4. Follow up: July 31st. Will review and possibly complete MOST form, pnd patient's discussion with his pulmonary team.   I spent 60 minutes providing this consultation, from 2 pm-3 pm. More than 50% of the time in this consultation was spent coordinating communication.     HISTORY OF PRESENT ILLNESS:  Darrell Ware is an 85 y.o.male with late stage Pulmonary Fibrosis. This is a f/u Palliative Care visit from June 1st, to continue addressing goals of care.    CODE STATUS: Full Code   PPS: 60% HOSPICE ELIGIBILITY/DIAGNOSIS: TBD   PAST MEDICAL HISTORY:      Past Medical History:  Diagnosis Date  . Atelectasis    . Chronic bronchitis    . Hemorrhoid    . Inguinal hernia      Left  . Postural hypotension    . RBBB 2010    SOCIAL HX:  Social History         Tobacco Use  . Smoking status: Former Smoker      Packs/day: 0.10      Years: 4.00      Pack years: 0.40      Types: Pipe, Cigars      Last attempt to quit: 02/24/1953      Years since quitting: 65.4  . Smokeless tobacco: Never Used  . Tobacco comment: only smoked an occ pipe/cigar x 3-4 years  Substance Use Topics  . Alcohol use: Yes      Alcohol/week: 7.0 standard drinks      Types: 7 Glasses of wine per week     ALLERGIES:       Allergies  Allergen Reactions  . Peanut-Containing Drug Products        Cough,wheezing -  GENERAL PEANUT ALLERGY  . Pirfenidone        Liver trouble  . Cialis [Tadalafil] Rash     PERTINENT MEDICATIONS:      Outpatient Encounter Medications as of 07/27/2018  Medication Sig  . AZOPT 1 % ophthalmic suspension Place 2 drops into the right eye 3 (three) times daily.   . Beta Sitosterol 40 % POWD 1 tablet by Does not apply route 2 (two) times daily.  . cephALEXin (KEFLEX) 500 MG capsule Take 1 capsule (500 mg total) by mouth 3 (three) times daily.  . Cyanocobalamin (B-12) 2500 MCG SUBL Place under the tongue daily.  . Flaxseed, Linseed, (FLAX SEED OIL PO) Take by mouth daily.  . fluticasone (FLONASE) 50 MCG/ACT nasal spray Place 2 sprays into the nose as needed.   Marland Kitchen. LOTEMAX 0.5 % GEL Place 1 drop into the right eye 2 (two) times daily.   . NON FORMULARY Shaklee supplement twice daily  . OFEV 100 MG CAPS TAKE 1 CAPSULE BY MOUTH TWICE DAILY WITH FOOD.  Marland Kitchen. predniSONE (DELTASONE) 10 MG tablet Take 4tabsx3days,3tabsx3days,2tabsx3days,1tabx3days,then stop  . tamsulosin (FLOMAX) 0.4 MG CAPS capsule Take 0.4 mg by mouth daily.  Marland Kitchen. ZIOPTAN 0.0015 % SOLN Place 1 drop into the right eye daily.    No facility-administered encounter medications on file as of 07/27/2018.      PHYSICAL EXAM:    PE deferred d/t audio only nature of visit.   Darrell LisMary P Paul Trettin, NP

## 2018-08-20 ENCOUNTER — Other Ambulatory Visit: Payer: Self-pay

## 2018-09-24 ENCOUNTER — Other Ambulatory Visit: Payer: Medicare Other | Admitting: Internal Medicine

## 2018-09-24 DIAGNOSIS — Z515 Encounter for palliative care: Secondary | ICD-10-CM

## 2018-09-25 ENCOUNTER — Encounter: Payer: Self-pay | Admitting: Internal Medicine

## 2018-09-25 NOTE — Progress Notes (Signed)
July 31st, 2020 [facility] Palliative Care Consult Note Telephone: [phone]  Fax: [phone]   Due to the current COVID-19 infection/crises, the family prefer, and have given their verbal consent for, a provider visit via telemedicine. HIPPA policies of confidentially were discussed. Video-audio (telehealth) contact was unable to be done due technical barriers from the patient's side.   PATIENT NAME: Darrell Ware DOB: [birth year] MRN: [id number]   PRIMARY CARE PROVIDER:   Lajean Manes, MD   REFERRING PROVIDER:  Lajean Manes, MD [address] [place], [place] [address]    RESPONSIBLE PARTY:   Spouse [name] (former pharmacist) ([phone], [phone] .   RECOMMENDATIONS and PLAN:  1.Advance Care Planning: Patient is a DNR. We were exploring filling out the MOST form in the past, but tabled this discussion pending patient asking his pulmonologist's opinion regarding likelihood of getting off of a ventilator, should there be need. Patient has a copy of the MOST form, as well as accompanying Scientist, clinical (histocompatibility and immunogenetics).   2. Goals of Care/supportive counseling setting of serious illness: Told disease is stable; he is happy to be outliving his prognosis, as entering his 8th year after diagnosis. Notes if wears nocturnal oxygen won't get a headache the next day. Able to be sedentary or ambulate about slowly short distances within the home without need for oxygen. Needs consistently when outside. Has portable pulse generated O2 delivery device not a effective as continuous delivery concentrator. Has back up large tank in case of emergency. Has a bit of clear phlegm qd though r/t allergies/post nasel gtt. Had to stop taking Flomax d/t SE low BP, but urinating okay.Continuing low dose OVEF (can't tolerate "therapeudic" doses d/t SE nausea/diarrhera.  Continue measures to slow disease progression: -maintaining positive attitude: visiting (social  distancing) best friends who are neighbors, going out in his electric scooter in early mornings to enjoy nature (paved paths available in his neighborhood) -We discussed setting reachable goals and making plans to meet those goals, towards promoting a sense of hope. May consider learning braille as his eyesight is very poor.  4. Follow up: Friday Nov 6th @ 2pm. Will review and possibly complete MOST form, pnd patient's discussion with his pulmonary team.   I spent 30 minutes providing this consultation, from 2 pm-2:30 pm. More than 50% of the time in this consultation was spent coordinating communication.     HISTORY OF PRESENT ILLNESS:  Rishaan H Flannery is an 85 y.o.male with late stage Pulmonary Fibrosis. This is a f/u Palliative Care visit from June 25th, to continue addressing goals of care.    CODE STATUS: Full Code   PPS: 60% HOSPICE ELIGIBILITY/DIAGNOSIS: TBD  PAST MEDICAL HISTORY:  Past Medical History:  Diagnosis Date  . Atelectasis   . Chronic bronchitis   . Hemorrhoid   . Inguinal hernia    Left  . Postural hypotension   . RBBB 2010    SOCIAL HX:  Social History   Tobacco Use  . Smoking status: Former Smoker    Packs/day: 0.10    Years: 4.00    Pack years: 0.40    Types: Pipe, Cigars    Quit date: 02/24/1953    Years since quitting: 65.6  . Smokeless tobacco: Never Used  . Tobacco comment: only smoked an occ pipe/cigar x 3-4 years  Substance Use Topics  . Alcohol use: Yes    Alcohol/week: 7.0 standard drinks    Types: 7 Glasses of wine  per week    ALLERGIES:  Allergies  Allergen Reactions  . Peanut-Containing Drug Products     Cough,wheezing - GENERAL PEANUT ALLERGY  . Pirfenidone     Liver trouble  . Cialis [Tadalafil] Rash     PERTINENT MEDICATIONS:  Outpatient Encounter Medications as of 09/24/2018  Medication Sig  . AZOPT 1 % ophthalmic suspension Place 2 drops into the right eye 3 (three) times daily.   . Beta Sitosterol 40 % POWD 1 tablet by Does  not apply route 2 (two) times daily.  . Cyanocobalamin (B-12) 2500 MCG SUBL Place under the tongue daily.  . Flaxseed, Linseed, (FLAX SEED OIL PO) Take by mouth daily.  . fluticasone (FLONASE) 50 MCG/ACT nasal spray Place 2 sprays into the nose as needed.   Marland Kitchen. LOTEMAX 0.5 % GEL Place 1 drop into the right eye 2 (two) times daily.   . NON FORMULARY Shaklee supplement twice daily  . OFEV 100 MG CAPS TAKE 1 CAPSULE BY MOUTH TWICE DAILY WITH FOOD.  Marland Kitchen. ZIOPTAN 0.0015 % SOLN Place 1 drop into the right eye daily.  . [DISCONTINUED] cephALEXin (KEFLEX) 500 MG capsule Take 1 capsule (500 mg total) by mouth 3 (three) times daily.  . [DISCONTINUED] predniSONE (DELTASONE) 10 MG tablet Take 4tabsx3days,3tabsx3days,2tabsx3days,1tabx3days,then stop  . [DISCONTINUED] tamsulosin (FLOMAX) 0.4 MG CAPS capsule Take 0.4 mg by mouth daily.   No facility-administered encounter medications on file as of 09/24/2018.     PHYSICAL EXAM:   PE deferred d/t audio only nature of visit.  Anselm LisMary P , NP

## 2018-09-27 ENCOUNTER — Other Ambulatory Visit: Payer: Self-pay

## 2018-11-12 ENCOUNTER — Other Ambulatory Visit: Payer: Self-pay | Admitting: Internal Medicine

## 2018-11-20 ENCOUNTER — Other Ambulatory Visit (HOSPITAL_COMMUNITY)
Admission: RE | Admit: 2018-11-20 | Discharge: 2018-11-20 | Disposition: A | Discharge status: Home / Self Care | Payer: Medicare Other | Source: Ambulatory Visit | Attending: Internal Medicine | Admitting: Internal Medicine

## 2018-11-20 DIAGNOSIS — Z20828 Contact with and (suspected) exposure to other viral communicable diseases: Secondary | ICD-10-CM | POA: Insufficient documentation

## 2018-11-20 DIAGNOSIS — Z01812 Encounter for preprocedural laboratory examination: Secondary | ICD-10-CM | POA: Insufficient documentation

## 2018-11-21 LAB — NOVEL CORONAVIRUS, NAA (HOSP ORDER, SEND-OUT TO REF LAB; TAT 18-24 HRS): SARS-CoV-2, NAA: NOT DETECTED

## 2018-11-23 ENCOUNTER — Other Ambulatory Visit: Payer: Self-pay

## 2018-11-23 ENCOUNTER — Encounter: Payer: Self-pay | Admitting: Internal Medicine

## 2018-11-23 ENCOUNTER — Ambulatory Visit: Payer: Medicare Other | Admitting: Internal Medicine

## 2018-11-23 VITALS — BP 124/66 | HR 71 | Ht 74.0 in | Wt 179.8 lb

## 2018-11-23 DIAGNOSIS — J84112 Idiopathic pulmonary fibrosis: Secondary | ICD-10-CM | POA: Diagnosis not present

## 2018-11-23 DIAGNOSIS — Z23 Encounter for immunization: Secondary | ICD-10-CM | POA: Diagnosis not present

## 2018-11-23 DIAGNOSIS — Z5181 Encounter for therapeutic drug level monitoring: Secondary | ICD-10-CM | POA: Diagnosis not present

## 2018-11-23 LAB — PULMONARY FUNCTION TEST
DL/VA % pred: 65 %
DL/VA: 2.41 ml/min/mmHg/L
DLCO unc % pred: 42 %
DLCO unc: 11.05 ml/min/mmHg
FEF 25-75 Pre: 2.31 L/sec
FEF2575-%Pred-Pre: 120 %
FEV1-%Pred-Pre: 84 %
FEV1-Pre: 2.54 L
FEV1FVC-%Pred-Pre: 114 %
FEV6-%Pred-Pre: 79 %
FEV6-Pre: 3.19 L
FEV6FVC-%Pred-Pre: 107 %
FVC-%Pred-Pre: 73 %
FVC-Pre: 3.19 L
Pre FEV1/FVC ratio: 80 %
Pre FEV6/FVC Ratio: 100 %

## 2018-11-23 LAB — HEPATIC FUNCTION PANEL
ALT: 22 U/L (ref 0–53)
AST: 17 U/L (ref 0–37)
Albumin: 4.2 g/dL (ref 3.5–5.2)
Alkaline Phosphatase: 56 U/L (ref 39–117)
Bilirubin, Direct: 0 mg/dL (ref 0.0–0.3)
Total Bilirubin: 0.4 mg/dL (ref 0.2–1.2)
Total Protein: 7.9 g/dL (ref 6.0–8.3)

## 2018-11-23 NOTE — Progress Notes (Addendum)
Results were discussed at office visit on 11/23/2018 with Dr. Chase Caller.  Nothing further needed.  [name], FNP     SIGNATURE    Dr. Brand Males, M.D., F.C.C.P,  Pulmonary and Critical Care Medicine Staff Physician, [place] Director - Interstitial Lung Disease  Program  Pulmonary [place] [facility] [place], [place], [address]  Pager: [phone], If no answer or between  15:00h - 7:00h: call [phone] Telephone: [phone]  1:10 PM 11/26/2018

## 2018-11-23 NOTE — Addendum Note (Signed)
Addended by: Suzzanne Cloud E on: 11/23/2018 12:41 PM   Modules accepted: Orders

## 2018-11-23 NOTE — Progress Notes (Signed)
OV 09/01/2014  Chief Complaint  Patient presents with   Follow-up    Pt is a former [facility] pt. Pt states he was on OFEV but stoppped March 2016. Pt stated he rapidly declined since not being on the OFEV. When pt saw [facility] pt requested to be placed back on OFEV. Pt is currently on 150mg  BID. Pt c/o DOE, intermittent cough.    Follow-up idiopathic pulmonary fibrosis (diagnosis based on age, male sex, negative autoimmune profile and a CT scan of the chest April 2013]  This is a transfer of care. Patient is to be followed by Dr. 10-14-1986 and once by Dr. Marcelyn Bruins for IPF. He is now following with me for Specialist IPF care. Presents with his wife both give history.  IPF diagnosed in spring of 2013. Then in the summer of 2014 through the expanded access program was on Pirfenidone but developed extremely high liver function anormlaity and had to be discontinued. Subsequently in the summer of 2015 was started on Nintedanib (Ofev) . He tolerated this well for 6 months. Then in the early part of 2016 started developing diarrhea with associated anorexia and weight loss. Symptoms are progressive. Symptoms ultimately became severe o he had abruptly discontinue the drug. Subsequently within a few days he started feeling dramatically better and all side effects resolved. However since that time he is having progressive dyspnea. He feels his interstitial lung disease is worse. A year ago overnight oxygen study and exertional pulse ox while he exercises on a recumbent bike at home showed no desaturation. However now for the last few months he is noticing desaturation into the 85% when he exercises. Correspondingly 2 days ago at our pulmonary office when he exerted he did desaturate to below 88%. He knows wants to restart his Ninetedanib ; wife wants to start him at low dose. He is currently taking old supply at the full dose at 150 mg twice daily. He is open to attending pulmonary rehabilitation because his home  exercise program has trailed off given his worsening symptoms. He is active in the pulmonary fibrosis foundation support group  His pulmonary function test within July 2015 when he was started on Ninntedanib and through March 2016 when he came off the drug have remained stable with moderate disease severity only. His FVC is 3.76 L/87%, total lung capacity is 5.12 L/66% and DLCO is 13.7/37%.    09/29/2014 Follow up : IPF and Chronic Hypoxic Resp Failure  Patient returns for a 4 week follow-up Patient was seen last visit with change of his pulmonary fibrosis medicine, OFEV , to lower dose to help with potential GI side effects. He is currently on 100 mg twice daily. He says he is tolerating well. Does have some mild nausea, but is tolerable. We discussed getting lab work today with LFTs. Patient was also started on oxygen with activity. He says this is really helped with his exercise regimen. He is waiting on his portable concentrator to be delivered. Order was checked on through his DME company today and has been approved. He has been referred to pulmonary rehabilitation, he is currently waiting for call for initiation of program. PFTs were repeated today that are similar to 2013. FVC 88%, FEV100%, ratio 80, diffusing capacity 38%. Patient says overall he feels that he is improved. He does get fatigued but feels that this is slightly improved. He tries to exercise daily. He travels quite a bit with his wife. Is planning a trip to  [place] in February.    OV 11/24/2014   Chief Complaint  Patient presents with   Follow-up    pt. states breathing is baseline. SOB at baseline. dry cough is baseline. denies chest pain/tightness.   Follow-up idiopathic pulmonary fibrosis - on low dose ofev due to gi side effects   He presents with his wife. He is tolerating the low-dose of Ofev quite well. Does not have any GI side effects. Overall he and his wife attest that his pulmonary health is stable.  Looking at the interstitial lung disease questionnaire below it is obvious that he has significant dyspnea when he climbs stairs or hill but otherwise is a high performing individual. He is planning a trip in [place] in January this is an annual winter trip where he goes for 2 weeks. Otherwise no new problems. He was attending the pulmonary fibrosis foundation support group yesterday. He uses oxygen with exertion   Last the function test August 2016 was normal this is for monitoring on Ofev which is needed every 3 months  OV 02/14/2015  Chief Complaint  Patient presents with   Follow-up    Pt states his breathing is unchanged since last OV. Pt states he feels a slight decline in breathing since one year ago - DOE. Pt c/o dry cough and intermittent chest discomfort.    Follow-up idiopathic pulmonary fibrosis-on nintedanib since summer 2015 and on low-dose since July 2016 seen due to GI side effects [hepatotoxicity with Esbriet 2014]  He has completed pulmonary rehabilitation. He says he learned a lot. His 6 minute walk distance improved with rehabilitation. He feels stable. However the rehabilitation specialist several options oxygen with exertion to 4 L. He does not think this is due to her decline. He thinks this is more due to his baseline requirement. Liver function test yesterday was normal. Spirometry today is FVC 3.5 L/72% and it is a decline since August 2016 PFT but as is often the case in our practice the PFT and spirometry is do not fully correlate. So I will taken at face value that he is stable. He is due to go to [place] in January 2017 and he wanted me to fill out paperwork. He wants to use the oxygen in flight and during landing.   OV 09/12/2015  Chief Complaint  Patient presents with   Follow-up    review pft.  pt states his breathing is unchanged- SOB with exertion.    Follow-up idiopathic pulmonary fibrosis: is-on nintedanib since summer 2015 and on low-dose since July 2016  seen due to GI side effects [hepatotoxicity with Esbriet 2014]  Last seen December 2016. After that he went 5 weeks to [place]. He and his wife enjoyed themselves a lot. This is an annual condition for them. Overall he is reporting stability in the last 6 months. However compared to a year or 2 ago he feels the dz  slowly gradually progressive. In fact on forced vital capacity he's had 8% progression in that in 2 years and 5.7% progression in 1 year with 0% progression in 6 months. His FVC and July 2015 was 3.89 he did in August 2016 it was 3.8. In December 2016 was 3.5 and currently it is 3.58. There is also some progression and DLCO over time. He is fully active. Last liver function test was December 2016.    OV 04/24/2016  Chief Complaint  Patient presents with   Follow-up    Pt states he got the flu in mid Jan  2018 and states he does not feel back to baseline. Pt c/o cough but states this is due to eating peanuts the other day. Pt denies CP/tightness and f/c/s.    Follow-up idiopathic pulmonary fibrosis on low-dose Ofev with prior history of hepatotoxicity with Pirfenidone (Esbriet)  Mid-January 2018 he had influenza and was treated outpatient basis with prednisone and Tamiflu. Since then he slowly recovered and is almost at his baseline. He did go to his annual [place] trip. He did use oxygen on flight. He uses oxygen on with exertion and when and air borne. - at end dropped to 87%; few days ago. In terms of his symptoms compared to last summer he feels overall stable except for the interim influenza. However in comparing the interstitial lung disease questionnaire from one half years ago scoring shows slight decline. Pulmicort function tests also show slight decline over time but only 1% variation decline compared to 6 months ago.   OV 07/25/2016  Chief Complaint  Patient presents with   Follow-up    review pft.  pt states he is doing well, notes low bp.     Follow-up idiopathic  pulmonary fibrosis (diagnosis based on age, male sex, negative autoimmune profile and a CT scan of the chest April   For folllwup. Overall stable but mothers day 2018 got exposed to huge pollen load while helping son with car dent. Then had low bp, cough and wheeze and fatigute but now slowly getting better but stil with occ wheeze. Tolerating ofev lower dose just fine. Overall compared to a year ago he feels he has progressed somewhat. FVC decline in 1 year is 3%. Due to hx of wheeze we did feno and is 30s ppb and elevated     OV 11/04/2016  Chief Complaint  Patient presents with   Follow-up    Pt complains of no symptoms other than SOB about [place]. Pt has non productive dry cough.     idiopathic pulmonary fibrosis follow-up. He is on low dose Ofev. Diagnosis greater than 5 years ago. He said 3% decline in lung function as of March/June 2018. Last visit was in June 2018. He continues on his Ofev 100 mg twice daily low dose. He is able to tolerated. Liver function tests in June 2018 was normal. He has deferred as high dose flu shot today. He will have it next month. Since his last visit he was up in the mountains at [place], [place] and then ran out of oxygen and got very hypoxemic. Currently he is back to baseline. There are no new issues.  OV 02/09/2017  Chief Complaint  Patient presents with   Follow-up    PFT done today. States that he has been doing good. Has occ. SOB with exertion and a cough that comes and goes. Denies any CP.     presents for follow-up. Overall he is on low-dose tablet. Diagnosis will be 6 years in March 2019. He tells me that he feels a slow steady progression of the disease over the course of the last 1 year or so. Pulmonary function test shows a 7% decline in [facility] and he believes that he has decline by that extent. [facility] interstitial lung disease questionnaire shows worsening of score because this course are getting lower. He  feels he can benefit from a handicap placard and also applying for [employer] priority list for electricity supply during power outage. He says he holds his breath and this  helps him. We felt that maybe an incentive spirometer would help him. He is tolerating his low-dose of an impediment just fine. KBILD stable   OV 08/21/2017  Chief Complaint  Patient presents with   Follow-up    Pt states he has been doing well since last visit. Pt thinks IPF is gradually progressing slowly. PFT was performed 6/25. Pt does wear 4-5L with exertion and also wears 2L O2 at night   [address] , 85 y.o. , with dob November 27, 1926 and male ,Not Hispanic or Latino from [address] [place] [address] - presents to lung clinic for ipf followup. Presents with his wife as always. He tells me that he's been on ofev for 3 or 4 years. Initially was in the high dosebut then developed GI discomfort and then reduce to the lower dose of 100 mg twice daily which he has continued without much of a problem except for on and off occasional nonspecific abdominal pain that is mild and transient. He said serial liver function with [place] that always been normal so far. He tells me in the last 6 months since I saw him he continues to have progressive worsening of shortness of breath. He needs 4 L with exertion but nothing at rest. He continues daily exercise and maintains significant activity at home. We discussed the possibility of going back up on the full dose given the decline. He is thinking about it. Your lung function test today and his FVC has declined further 3% compared to last visit. The entire profile as listed below   OV 10/15/2017   Chief Complaint  Patient presents with   Follow-up    Pt did increase the OFEV to the  and states he has had some occ. diarrhea and some GI upsets, which he did have with the lower dose of the OFEV, but other than that, he has had no real problems. Pt states breathing is about the  same. Pt states he has been able to tolerate the heat with his breathing but does have problems with hills.     Follow-up idiopathic pulmonary fibrosis  This visit is a visit after increasing his ofev from 100 mg twice daily to 150 mg twice daily. We did this because his lung function a decline somewhat in the past one year. He tells me that after increasing his dose that his diarrhea and GI discomfort is around the same. And otherwise this has not gotten worse. However it appears that he might be more fatigued. His wife feels it is because of ofev. He has no other reason. At this point in time he has resolved to continue with ful dose ofev and see what happens. He has oxygen for exertion and at night. He does have chronic cough which she says is variable in severity and it rates it as anywhere between 3-5 on a 10 point scale  K-BILD ILD QUESTIONNAIRE, Symptom score over prior 2 weeks  7-none, 6-rarely, 5-occ, 5-some times, 3-sev times, 2-most times, 1-every time 11/24/2014  04/24/2016  02/09/2017   Dyspnea for stairs, incline or hill Chest Tightness Worry about seriousness of lung complaint Avoided doing things that make you dyspneic Have you felt loss of control of lung condition (reversed from original) Felt fed up due to lung condition Felt urge to breathe aka air hunger 6 6 6  Has lung condition made you feel anxious How often have you experienced wheezing or whistling sound How much of the time have you felt your lung dz is getting worse How much has your lung condition interfered with job or daily task Were you expecting your lung condition to get worse How much has your lung function limited you carrying things like groceris How much has your lung function made you think of EOL? Total 78 68 71  Are you financially worse off Grand Total 84 72 75     OV 01/18/2018  Subjective:    Patient ID: Darrell Ware, male , DOB: 11/22/1926 , age 37 y.o. , MRN: [id number] , ADDRESS: [address] [place] [address]   01/18/2018 -   Chief Complaint  Patient presents with   Follow-up    tighness and cough with clear mucus, SOB on exertion (O2)     HPI Darrell Ware 85 y.o. -presents with his wife for IPF follow-up.  Overall he is stable.  He tells me that at home he gets his exercise by using nasal cannula oxygen at 5-6 L from the oxygen cylinder and he paces back-and-forth.  First 15 minutes is not a problem but when he gets to 1 hour it becomes a problem.  He uses portable imaging system when climbing hills and walking outdoors.  He feels this is insufficient at 2 L.  He feels is because of the pulse system.  He is now taking Internet 100 mg twice daily.  He does not want to do 150 mg twice daily.  This is because the higher dose gave him abdominal pains.  Most recent liver function test October 15, 2017 was fine.  He had pulmonary function test today and it is stable.  Personally visualized.  He and his wife plan to go to [place] in February 2019 for 1 month.  However he feels that he is getting too old and thinks that shutting down on him overall.  Therefore he is rethinking this very carefully.    OV 05/19/2018  Subjective:  Patient ID: Darrell Ware, male , DOB: 03/04/1926 , age 80 y.o. , MRN: [id number] , ADDRESS: [address] [place] [facility] [address]   05/19/2018 -   Chief Complaint  Patient presents with   Follow-up    Pt stated while in [place] February 2020, he started having more problems with his breathing. States when he is doing activities, he has been having to use more O2. Pt states O2 has dropped to the 70-80s with little activities and also has had swelling in ankles.   Follow-up idiopathic pulmonary fibrosis on nintedanib  HPI Darrell Ware 85 y.o. -presents for follow-up.  Last seen November 2019.  In the interim I did see him at the support  group and he looked okay.  He tells me early February 2020 he went to [place] for his usuual winter trip and he returned the end of February 2020.  He says during this trip he started noticing a decline with shortness of breath.  And since then he said progressive decline with associated chest tightness and worsening cough.  He says going to the bathroom he desaturates to 74%.  However in the last few days he says he is beginning to feel better.  When we walked him he desaturated to 85%.  This is significantly worse than August 2019 as documented below.  There is no associated sputum or fever or infectious symptoms.  There is no associated worsening in pedal edema.  He is noted to have bilateral lower extremity varicose veins.  He feels he might have progressive ILD.  He feels the end might be near.  He is using oxygen at night and 4 L with exertion.  Of note he has a right infrascapular sebaceous cyst that is infected.  He wants to take cephalexin for this.  In the past this is helped.  He is asking for a prescription.          OV 11/23/2018  Subjective:  Patient ID: Darrell Ware, male , DOB: [birth year] , age 85 y.o. , MRN: [id number] , ADDRESS: [address] [place] [facility] [address]   11/23/2018 -   Chief Complaint  Patient presents with   Follow-up    review PFT.  pt states he is doing well, notes that he is up to 3.5lpm O2 at home and this is not adequately managing his levels.     Follow-up idiopathic pulmonary fibrosis on low-dose 100 mg twice daily nintedanib   HPI Darrell Ware 85 y.o. -presents with his wife.  Since his visit in March 2020 is actually little bit better.  His wife also states that for some reason he was less well in March 2020 but since then has improved.  He social distancing well.  His ILD symptom score is listed below and shows a slight improvement.  He takes his nintedanib 100 mg twice daily as scheduled but on and off he has diarrhea for which he takes  Imodium but if this does not work he will give a holiday with his nintedanib.  Few weeks ago he gave a 5-day holiday and now he is back on nintedanib.  He feels he has had a good reset.  Walking desaturation test also shows a slight improvement.  He has no new complaints.  He will have a high-dose flu shot today.     SYMPTOM SCALE - ILD 05/19/2018  11/23/2018   O2 use Room air Room air  Shortness of Breath 0 -> 5 scale with 5 being worst (score 6 If unable to do)   At rest 0 0  Simple tasks - showers, clothes change, eating, shaving 4 3  Household (dishes, doing bed, laundry) 4 4  Shopping x x  Walking level at own pace 4 4  Walking keeping up with others of same age 79 5  Walking up Stairs 4.5 5  Walking up Hill 4.5 5  Total (40 - 48) Dyspnea Score 29 26  How bad is your cough? 3 2  How bad is your fatigue 3.5 3     Results for Darrell Ware, Darrell Ware (MRN [id number]) as of 11/23/2018 12:19  Ref. Range 08/30/2013 09:56 05/11/2014 09:54 09/29/2014 11:51 09/12/2015 10:46 04/22/2016 09:30 07/25/2016 11:19 02/09/2017 11:08 08/18/2017 15:57 01/18/2018 10:53 11/23/2018 10:58  FVC-Pre Latest Units: L 3.89 3.76 3.80 3.49 3.44 3.37 3.25 3.08 3.29 3.19  FVC-%Pred-Pre Latest Units: % 89 87 88 82 81 80 77 74 79 73    Results for Darrell Ware, Darrell Ware (MRN [id number]) as of 11/23/2018 12:19  Ref. Range 08/30/2013 09:56 05/11/2014 09:54 09/29/2014 11:51 09/12/2015 10:46 04/22/2016 09:30 07/25/2016 11:19 02/09/2017 11:08 08/18/2017 15:57 01/18/2018 10:53 11/23/2018 10:58  DLCO unc Latest Units: ml/min/mmHg 13.51 13.71 13.83 13.39  12.17  10.98 10.43 11.27 11.05  DLCO unc % pred Latest Units: % 37 37 38 36  33 42       Simple office walk 185 feet x  3 laps goal with forehead probe 10/15/2017  05/19/2018  11/23/2018   O2 used Room air Room air Room air  Number laps completed 3  1 laps only and desat 3 laps with forehead prbe  Comments about pace Normal pace Normal pace normal  Resting Pulse Ox/HR 100% and 68/min 97% and  79/min 98%  Final Pulse Ox/HR 87% and 83/min 85% and 91/mn - at 1 lap 89%  Desaturated </= 88% yes yes   Desaturated <= 3% points yes yes   Got Tachycardic >/= 90/min no yes   Symptoms at end of test Moderate dyspnea moderate Moderate +  Miscellaneous comments x WORSE     ROS - per HPI     has a past medical history of Atelectasis, Chronic bronchitis, Hemorrhoid, Inguinal hernia, Postural hypotension, and RBBB (2010).   reports that he quit smoking about 65 years ago. His smoking use included pipe and cigars. He has a 0.40 pack-year smoking history. He has never used smokeless tobacco.  Past Surgical History:  Procedure Laterality Date   CATARACT EXTRACTION     bilat   CORNEAL TRANSPLANT  1999   Right   left arm fracture  1950   RETINAL DETACHMENT SURGERY     Bilat   TONSILLECTOMY  1935   VASECTOMY  1972    Allergies  Allergen Reactions   Peanut-Containing Drug Products     Cough,wheezing - GENERAL PEANUT ALLERGY   Pirfenidone     Liver trouble   Cialis [Tadalafil] Rash    Immunization History  Administered Date(s) Administered   Influenza Split 11/25/2011, 01/02/2014   Influenza, High Dose Seasonal PF 11/25/2015, 01/09/2017, 12/29/2017   Influenza,inj,Quad PF,6+ Mos 11/24/2012, 11/25/2014   Pneumococcal-Unspecified 04/24/2012    Family History  Problem Relation Age of Onset   Heart failure Father    Heart failure Mother    Macular degeneration Brother    Macular degeneration Sister      Current Outpatient Medications:    AZOPT 1 % ophthalmic suspension, Place 2 drops into the right eye 3 (three) times daily. , Disp: , Rfl:    Beta Sitosterol 40 % POWD, 1 tablet by Does not apply route 2 (two) times daily., Disp: , Rfl:    Cyanocobalamin (B-12) 2500 MCG SUBL, Place under the tongue daily., Disp: , Rfl:    Flaxseed, Linseed, (FLAX SEED OIL PO), Take by mouth daily., Disp: , Rfl:    fluticasone (FLONASE) 50 MCG/ACT nasal spray, Place 2  sprays into the nose as needed. , Disp: , Rfl:    LOTEMAX 0.5 % GEL, Place 1 drop into the right eye 2 (two) times daily. , Disp: , Rfl:    NON FORMULARY, Shaklee supplement twice daily, Disp: , Rfl:    OFEV 100 MG CAPS, TAKE 1 CAPSULE BY MOUTH TWICE DAILY WITH FOOD., Disp: 60 capsule, Rfl: 4   ZIOPTAN 0.0015 % SOLN, Place 1 drop into the right eye daily., Disp: , Rfl:       Objective:   Vitals:   11/23/18 1148  BP: 124/66  Pulse: 71  SpO2: 96%  Weight: 179 lb 12.8 oz (81.6 kg)  Height:  (1.88 m)    Estimated body mass index is 23.08 kg/m as calculated from the following:   Height as  of this encounter: 6\' 2"  (1.88 m).   Weight as of this encounter: 179 lb 12.8 oz (81.6 kg).  @WEIGHTCHANGE @  [employer]   11/23/18 1148  Weight: 179 lb 12.8 oz (81.6 kg)     Physical Exam  General Appearance:    Alert, cooperative, no distress, appears stated age - norma , Deconditioned looking - no , OBESE  - no, Sitting on Wheelchair -  no  Head:    Normocephalic, without obvious abnormality, atraumatic  Eyes:    PERRL, conjunctiva/corneas clear,  Ears:    Normal TM's and external ear canals, both ears  Nose:   Nares normal, septum midline, mucosa normal, no drainage    or sinus tenderness. OXYGEN ON  - no . Patient is @ ra   Throat: MASK  Neck:   Supple, symmetrical, trachea midline, no adenopathy;    thyroid:  no enlargement/tenderness/nodules; no carotid   bruit or JVD  Back:     Symmetric, no curvature, ROM normal, no CVA tenderness  Lungs:     Distress - no , Wheeze no, Barrell Chest - no, Purse lip breathing - no, Crackles - YES AT BASE   Chest Wall:    No tenderness or deformity.    Heart:    Regular rate and rhythm, S1 and S2 normal, no rub   or gallop, Murmur - no  Breast Exam:    NOT DONE  Abdomen:     Soft, non-tender, bowel sounds active all four quadrants,    no masses, no organomegaly. Visceral obesity - no  Genitalia:   NOT DONE  Rectal:   NOT DONE    Extremities:   Extremities - normal, Has Cane - no, Clubbing - no, Edema - no  Pulses:   2+ and symmetric all extremities  Skin:   Stigmata of Connective Tissue Disease - no  Lymph nodes:   Cervical, supraclavicular, and axillary nodes normal  Psychiatric:  Neurologic:   Pleasant - yes, Anxious - no, Flat affect - no  CAm-ICU - neg, Alert and Oriented x 3 - yes, Moves all 4s - yes, Speech - normal, Cognition - intact           Assessment:       ICD-10-CM   1. Idiopathic pulmonary fibrosis (HCC)  J84.112   2. Therapeutic drug monitoring  Z51.81 Hepatic function panel       Plan:     Patient Instructions     ICD-10-CM   1. Idiopathic pulmonary fibrosis (HCC)  J84.112   2. Therapeutic drug monitoring  Z51.81 Hepatic function panel      -IPF is  stable since march 2020 - on and off ofev intolerance and I think your stop/ start plan is ok  Plan - high dose flu shot 11/23/2018  - check LFT - use o2 with exertion and at night - continue ofev 100mg  twice daily with prn diarrhea control  Followup 6 months or sooner if needed; ILD symptoms and simple walk test at followup  > 50% of this > 25 min visit spent in face to face counseling or coordination of care - by this undersigned MD - Dr Kalman ShanMurali Deserea Bordley. This includes one or more of the following documented above: discussion of test results, diagnostic or treatment recommendations, prognosis, risks and benefits of management options, instructions, education, compliance or risk-factor reduction    SIGNATURE    Dr. Kalman ShanMurali Luiza Carranco, M.D., F.C.C.P,  Pulmonary and Critical Care Medicine Staff Physician, [facility]  Center Director - Interstitial Lung Disease  Program  Pulmonary [place] [facility] [place], [place], [address]  Pager: [phone], If no answer or between  15:00h - 7:00h: call [phone] Telephone: [phone]  12:38 PM 11/23/2018

## 2018-11-23 NOTE — Patient Instructions (Addendum)
ICD-10-CM   1. Idiopathic pulmonary fibrosis ([place])  J84.112   2. Therapeutic drug monitoring  Z51.81 Hepatic function panel      -IPF is  stable since march 2020 - on and off ofev intolerance and I think your stop/ start plan is ok  Plan - high dose flu shot 11/23/2018  - check LFT - use o2 with exertion and at night - continue ofev 100mg  twice daily with prn diarrhea control  Followup 6 months or sooner if needed; ILD symptoms and simple walk test at followup

## 2018-11-26 ENCOUNTER — Other Ambulatory Visit: Payer: Self-pay | Admitting: Internal Medicine

## 2018-12-01 NOTE — Progress Notes (Signed)
Pt made aware LFTs normal. Continue with OFEV. Nothing further needed.

## 2018-12-31 ENCOUNTER — Other Ambulatory Visit: Payer: Medicare Other | Admitting: Internal Medicine

## 2018-12-31 DIAGNOSIS — Z515 Encounter for palliative care: Secondary | ICD-10-CM

## 2019-01-02 ENCOUNTER — Encounter: Payer: Self-pay | Admitting: Internal Medicine

## 2019-01-02 NOTE — Progress Notes (Signed)
Nov 6th, 2020 [facility] Palliative Care Consult Note Telephone: [phone]  Fax: [phone]   Due to the current COVID-19 infection/crises, the family prefer, and have given their verbal consent for, a provider visit via telemedicine. HIPPA policies of confidentially were discussed. Video-audio (telehealth) contact was unable to be done due technical barriers from the patient's side.   PATIENT NAME: [place]rell [place] DOB: [birth year] MRN: [id number]   PRIMARY CARE PROVIDER:   Lajean Manes, MD   REFERRING PROVIDER:  Lajean Manes, MD [address] [place], [place] [address]    RESPONSIBLE PARTY:   Spouse [place]rell Ware (former pharmacist) ([phone], [phone] .    RECOMMENDATIONS and PLAN:  1.Advance Care Planning: Patient is a DNR. MOST form tabled pending decline in respiratory condition. Patient has a copy of the MOST form, as well as accompanying Scientist, clinical (histocompatibility and immunogenetics).    2. Goals of Care/supportive counseling setting of serious illness:  Continues upbeat and enjoying life within confines of his respiratory illness. Practices breathing exercises every morning while lying supine in bed. Is getting out and about in his electric scooter outside if the weather is good, and exercises within the home (walking) with O2 for about 40 min a day. Tried to achieve target HR for his age (about 8). Notes he has to dial up his continues flow O2 from 2L up to 5L by the end of his regimen, to maintain Sats in the 90's. When sitting without exercise, is able to keep oxygen off. Feel chest is "tight" these last few weeks, but mentions he goes through these cycles. Overall stable. Chronic non-productive cough of his pulmonary fibrosis. Continues strict social distancing, and can continue contact with friends within these confined. Just back from a beach trip with his family which he really enjoyed. Continues independent in his ADLs. Weight 179 lbs. At a height  of 6'2" his BMI is 23 kg.m2. Continuing low dose OVEF (can't tolerate "therapeudic" doses d/t SE nausea/diarrhera.   3. Follow up: Thursday 03/24/2019 at 2pm. I spent 30 minutes providing this consultation, from 2 pm-2:30 pm. More than 50% of the time in this consultation was spent coordinating communication.     HISTORY OF PRESENT ILLNESS:  Darrell Ware is an 85 y.o.male with late stage Pulmonary Fibrosis. This is a f/u Palliative Care visit from July 31st.   CODE STATUS: DNR   PPS: 60% HOSPICE ELIGIBILITY/DIAGNOSIS: TBD  PAST MEDICAL HISTORY:      Past Medical History:  Diagnosis Date  . Atelectasis   . Chronic bronchitis   . Hemorrhoid   . Inguinal hernia    Left  . Postural hypotension   . RBBB 2010    SOCIAL HX:  Social History        Tobacco Use  . Smoking status: Former Smoker    Packs/day: 0.10    Years: 4.00    Pack years: 0.40    Types: Pipe, Cigars    Quit date: 02/24/1953    Years since quitting: 65.6  . Smokeless tobacco: Never Used  . Tobacco comment: only smoked an occ pipe/cigar x 3-4 years  Substance Use Topics  . Alcohol use: Yes    Alcohol/week: 7.0 standard drinks    Types: 7 Glasses of wine per week    ALLERGIES:       Allergies  Allergen Reactions  . Peanut-Containing Drug Products     Cough,wheezing - GENERAL PEANUT ALLERGY  . Pirfenidone  Liver trouble  . Cialis [Tadalafil] Rash     PERTINENT MEDICATIONS:      Outpatient Encounter Medications as of 09/24/2018  Medication Sig  . AZOPT 1 % ophthalmic suspension Place 2 drops into the right eye 3 (three) times daily.   . Beta Sitosterol 40 % POWD 1 tablet by Does not apply route 2 (two) times daily.  . Cyanocobalamin (B-12) 2500 MCG SUBL Place under the tongue daily.  . Flaxseed, Linseed, (FLAX SEED OIL PO) Take by mouth daily.  . fluticasone (FLONASE) 50 MCG/ACT nasal spray Place 2 sprays into the nose as needed.   Marland Kitchen LOTEMAX 0.5 % GEL Place 1 drop  into the right eye 2 (two) times daily.   . NON FORMULARY Shaklee supplement twice daily  . OFEV 100 MG CAPS TAKE 1 CAPSULE BY MOUTH TWICE DAILY WITH FOOD.  Marland Kitchen ZIOPTAN 0.0015 % SOLN Place 1 drop into the right eye daily.  . [DISCONTINUED] cephALEXin (KEFLEX) 500 MG capsule Take 1 capsule (500 mg total) by mouth 3 (three) times daily.  . [DISCONTINUED] predniSONE (DELTASONE) 10 MG tablet Take 4tabsx3days,3tabsx3days,2tabsx3days,1tabx3days,then stop  . [DISCONTINUED] tamsulosin (FLOMAX) 0.4 MG CAPS capsule Take 0.4 mg by mouth daily.   No facility-administered encounter medications on file as of 09/24/2018.     PHYSICAL EXAM:   PE deferred d/t audio only nature of visit. Patient without dyspnea during our conversation.   Anselm Lis, NP

## 2019-01-03 ENCOUNTER — Other Ambulatory Visit: Payer: Self-pay

## 2019-02-26 ENCOUNTER — Other Ambulatory Visit: Payer: Self-pay | Admitting: Pulmonary Disease

## 2019-02-26 ENCOUNTER — Telehealth: Payer: Self-pay | Admitting: Pulmonary Disease

## 2019-02-26 MED ORDER — ALBUTEROL SULFATE HFA 108 (90 BASE) MCG/ACT IN AERS: 2.0000 | INHALATION_SPRAY | Freq: Four times a day (QID) | RESPIRATORY_TRACT | 1 refills | Status: AC | PRN

## 2019-02-26 MED ORDER — DOXYCYCLINE HYCLATE 100 MG PO TABS: 100.0000 mg | ORAL_TABLET | Freq: Two times a day (BID) | ORAL | 0 refills | Status: AC

## 2019-02-26 NOTE — Telephone Encounter (Signed)
Patient called answering service  Shortness of breath, wheezing, significant desaturations with activity  He will take the prednisone tapering pack that he has on hand Called in doxycycline and albuterol MDI

## 2019-02-28 ENCOUNTER — Telehealth: Payer: Self-pay | Admitting: Pulmonary Disease

## 2019-02-28 ENCOUNTER — Other Ambulatory Visit: Payer: Medicare Other | Admitting: Internal Medicine

## 2019-02-28 ENCOUNTER — Encounter: Payer: Self-pay | Admitting: Internal Medicine

## 2019-02-28 DIAGNOSIS — Z515 Encounter for palliative care: Secondary | ICD-10-CM

## 2019-02-28 DIAGNOSIS — R059 Cough, unspecified: Secondary | ICD-10-CM

## 2019-02-28 DIAGNOSIS — J84112 Idiopathic pulmonary fibrosis: Secondary | ICD-10-CM

## 2019-02-28 DIAGNOSIS — J9611 Chronic respiratory failure with hypoxia: Secondary | ICD-10-CM

## 2019-02-28 DIAGNOSIS — R05 Cough: Secondary | ICD-10-CM

## 2019-02-28 NOTE — Progress Notes (Signed)
Jan 4th, 2020 [facility] Consult Note Telephone: [phone]  Fax: [phone]   Due to the current COVID-19 infection/crises, the family prefer, and have given their verbal consent for, a provider visit via telemedicine. HIPPA policies of confidentially were discussed. Video-audio (telehealth) contact was unable to be done due technical barriers from the patient's side.   PATIENT NAME: [place]rell Ware DOB: [birth year] MRN: [id number]   PRIMARY CARE PROVIDER:   Lajean Manes, MD   REFERRING PROVIDER:  Lajean Manes, MD [address] [place], [place] [address]    RESPONSIBLE PARTY:   Spouse [name] (former pharmacist) ([phone], [phone] .   RECOMMENDATIONS and PLAN:  1. Advance Care Planning:   A. Directives: Patient is a DNR. We reviewed components of his MOST form. At this point he is still thinking about "scope of medical interventions"; wondering about possibility of being able to come off a ventilator in the setting of his advanced pulmonary disease, should he decompensate to that need. He plans to ask his pulmonary team. Patient would prefer not to be on a vent, or to limit his time on a vent should there be little chance of getting off machine. He states yes to antibiotics, and to determine use of IVFs at the time of need. No to tube feeding.    2. Goals of Care/supportive counseling setting of serious illness: Patient had a rough weekend. Four days ago developed racking coughing spasms, and oxygen desaturations to the 70's with minimal exertion such as walking to the bedroom. Previous baseline able to maintain sats on room air when at rest/conversant. Needed increased oxygenation to 6L . Improved with initiation of Prednisone dose pack and Doxycycline. Currently able to maintain sats 90's at rest on 4 LPM. Nocturnal cough was disturbing his sleep these last nights. Patient plans f/u pulmonary clinic visit after  screening COVID test (scheduled for tomorrow). His pulmonary team is ordering a compressor that can deliver oxygen to 10 LPM (current machine only up to 5).  -patient plans to ask his pulmonary team for prn Tessalon Pearls daytime use, with evening dose Hycodan syrup to help with sleep.   -patient plans to ask pulmonary team if should get COVID-19 vaccination.   3. Follow up: 04/18/2019 @ 1pm  I spent 60 minutes providing this consultation, from 2 pm-3 pm. More than 50% of the time in this consultation was spent coordinating communication.     HISTORY OF PRESENT ILLNESS:  Darrell Ware is an 86 y.o.male with late stage Pulmonary Fibrosis. This is a f/u Palliative Care visit from Nov 6th.   CODE STATUS: DNR   PPS: 60% HOSPICE ELIGIBILITY/DIAGNOSIS: TBD  PAST MEDICAL HISTORY:  Past Medical History:  Diagnosis Date  . Atelectasis   . Chronic bronchitis   . Hemorrhoid   . Inguinal hernia    Left  . Postural hypotension   . RBBB 2010    SOCIAL HX:  Social History   Tobacco Use  . Smoking status: Former Smoker    Packs/day: 0.10    Years: 4.00    Pack years: 0.40    Types: Pipe, Cigars    Quit date: 02/24/1953    Years since quitting: 66.0  . Smokeless tobacco: Never Used  . Tobacco comment: only smoked an occ pipe/cigar x 3-4 years  Substance Use Topics  . Alcohol use: Yes    Alcohol/week: 7.0 standard drinks    Types: 7 Glasses of  wine per week    ALLERGIES:  Allergies  Allergen Reactions  . Peanut-Containing Drug Products     Cough,wheezing - GENERAL PEANUT ALLERGY  . Pirfenidone     Liver trouble  . Cialis [Tadalafil] Rash     PERTINENT MEDICATIONS:  Outpatient Encounter Medications as of 02/28/2019  Medication Sig  . albuterol (VENTOLIN HFA) 108 (90 Base) MCG/ACT inhaler Inhale 2 puffs into the lungs every 6 (six) hours as needed for wheezing or shortness of breath.  . AZOPT 1 % ophthalmic suspension Place 2 drops into the right eye 3 (three) times daily.   .  Beta Sitosterol 40 % POWD 1 tablet by Does not apply route 2 (two) times daily.  . Cyanocobalamin (B-12) 2500 MCG SUBL Place under the tongue daily.  Marland Kitchen doxycycline (VIBRA-TABS) 100 MG tablet Take 1 tablet (100 mg total) by mouth 2 (two) times daily.  . Flaxseed, Linseed, (FLAX SEED OIL PO) Take by mouth daily.  . fluticasone (FLONASE) 50 MCG/ACT nasal spray Place 2 sprays into the nose as needed.   Marland Kitchen LOTEMAX 0.5 % GEL Place 1 drop into the right eye 2 (two) times daily.   . NON FORMULARY Shaklee supplement twice daily  . OFEV 100 MG CAPS TAKE 1 CAPSULE BY MOUTH TWICE DAILY WITH FOOD. CALL [phone] TO REFILL  . ZIOPTAN 0.0015 % SOLN Place 1 drop into the right eye daily.   No facility-administered encounter medications on file as of 02/28/2019.    PHYSICAL EXAM:   PE deferred d/t audio only nature of visit. Patient without dyspnea during our conversation  Anselm Lis, NP

## 2019-02-28 NOTE — Telephone Encounter (Signed)
Called and spoke with pt and stated to him the info from [facility]. Pt said that his current concentrator goes up to only 5L. Pt uses [facility]. I stated to pt that we would place an order to [facility] for them to provide him with a larger concentrator that goes up to 6L and he verbalized understanding.  Pt has not been around anyone that he is aware of that has either been sick or exposed to covid. Pt said someone who lives two houses over from him has covid but pt has been staying at the house due to the pandemic.  Stated to pt to continue taking the prednisone and doxy to complete the course and also stated to pt that we needed to get him tested for covid. Pt verbalized understanding. I provided his wife with the info on how to get an appt scheduled to get pt tested for covid. Also placed order for pt to receive a larger concentrator from [employer].  Stated to pt and his wife once we saw pt had a neg covid test, we would get him in to office for in-person visit and they verbalized understanding. Nothing further needed.

## 2019-02-28 NOTE — Telephone Encounter (Signed)
Sorry also wanted to make note of this:  As always if the patient would like to have a virtual visit this is obviously available for the patient with APP or Dr. Marchelle Gearing.  Please make sure that a virtual option has been offered to the patient in case of a would like to have an earlier appointment prior to receiving Covid testing results.  Elisha Headland, FNP

## 2019-02-28 NOTE — Telephone Encounter (Signed)
Tomma Lightning, MD    02/26/19 11:13 AM Note   Patient called answering service  Shortness of breath, wheezing, significant desaturations with activity  He will take the prednisone tapering pack that he has on hand Called in doxycycline and albuterol MDI      Primary Pulmonologist: MR Last office visit and with whom: 11/23/18 with MR What do we see them for (pulmonary problems): IPF Last OV assessment/plan: Instructions    Return in about 6 months (around 05/23/2019) for ILD.   ICD-10-CM   1. Idiopathic pulmonary fibrosis (HCC)  J84.112   2. Therapeutic drug monitoring  Z51.81 Hepatic function panel      -IPF is  stable since march 2020 - on and off ofev intolerance and I think your stop/ start plan is ok  Plan - high dose flu shot 11/23/2018  - check LFT - use o2 with exertion and at night - continue ofev 100mg  twice daily with prn diarrhea control  Followup 6 months or sooner if needed; ILD symptoms and simple walk test at followup     Was appointment offered to patient (explain)?  Pt wants recommendations   Reason for call: Called and spoke with pt who stated he began having worsening SOB, wheezing, and drops in O2 sats with activity which started 4 days ago and began to get worse so that was when he called on-call doctor.  Pt said he started pred taper and doxy on Saturday, 1/2 after meds were called in. Pt also stated had a chill earlier today and also has been coughing with occ clear or white phlegm.  Pt said he took tylenol this morning due to having a bad headache. Had pt check his temp while on the phone with me and he said the temp was 98.4 but pt said he recently drank ice water about 4 min prior to help control his cough.   Pt said when he goes from the living room to bathroom and bedroom on O2, his sats will drop quickly to the 70s. Pt said it could take 4-5 minutes to get sats back up to the 90s. Pt will keep his concentrator at 4L.  Pt wants  to know what we recommend to help with his symptoms and also if a cough med could be sent in to help with his cough as well. Saturday, please advise on this. Thanks!

## 2019-02-28 NOTE — Telephone Encounter (Signed)
02/28/2019 1428  Patient needs to continue to monitor his temperatures.  Patient can complete prednisone and doxycycline.  Patient needs to increase oxygen levels up to 6 L to maintain oxygen saturations above 88%.  As the patient had any exposures to SARS-CoV-2?  Any known sick contacts?   Likely with patient's symptoms he should also obtain outpatient Covid testing:   To Schedule COVID19 testing:   Please have the patient text "COVID" to [phone]        OR      log onto the Internet and access [email or web address] to make an online appointment for Covid testing  If you are unable to text or unable to access the website to make an online appointment then you can contact [phone] to get assistance with scheduling a Covid test over the phone.  Please contact our office if they are unable to get you scheduled for a Covid test within a timely manner.   COVID Testing Site Locations (For sick patients only, pre-procedure is done differently)  . [facility]       o [address] [place], [place] [address] . [address] o [address] Educational psychologist  (on [employer])  o Sales executive  . [facility] o [address] [place], [place] [address]   [facility], [facility] will be open from 10 a.m. - 3 p.m.    We will need to have a documented negative Covid test prior to an office evaluation.  If the patient continues to have worsening symptoms we can also offer an appointment slot in the respiratory clinic this evening from 530-7 30.  Continue to take Ofev.  We will also route to Dr. Marchelle Gearing as Lorain Childes.  Elisha Headland, FNP

## 2019-02-28 NOTE — Telephone Encounter (Addendum)
Called and spoke with Patient.  Offered my chart virtual office visit with NP.  Patient stated he could do a my chart visit, with help from his Wife.  Patient did understand my chart visit instructions.  Patient declined my chart visit at this time. Patient stated he is scheduled tomorrow for Covid testing, and will follow up after he receives results. Patient understands it may take 2-3 days for Covid results. Nothing further at this time.

## 2019-03-01 ENCOUNTER — Ambulatory Visit: Payer: Medicare Other | Attending: Internal Medicine

## 2019-03-01 ENCOUNTER — Other Ambulatory Visit: Payer: Self-pay

## 2019-03-01 DIAGNOSIS — Z20822 Contact with and (suspected) exposure to covid-19: Secondary | ICD-10-CM

## 2019-03-03 LAB — NOVEL CORONAVIRUS, NAA: SARS-CoV-2, NAA: NOT DETECTED

## 2019-03-17 ENCOUNTER — Other Ambulatory Visit: Payer: Self-pay

## 2019-03-17 ENCOUNTER — Other Ambulatory Visit: Payer: Medicare Other | Admitting: Internal Medicine

## 2019-03-17 ENCOUNTER — Encounter: Payer: Self-pay | Admitting: Internal Medicine

## 2019-03-17 DIAGNOSIS — Z515 Encounter for palliative care: Secondary | ICD-10-CM

## 2019-03-17 DIAGNOSIS — Z7189 Other specified counseling: Secondary | ICD-10-CM

## 2019-03-17 NOTE — Progress Notes (Signed)
Jan 21st, 2021 [facility] Palliative Care Consult Note Telephone: [phone]  Fax: [phone]   Due to the current COVID-19 infection/crises, the family prefer, and have given their verbal consent for, a provider visit via telemedicine. HIPPA policies of confidentially were discussed.    PATIENT NAME: Darrell Ware DOB: October 12, 1926 MRN: [id number]   PRIMARY CARE PROVIDER:   Lajean Manes, MD   REFERRING PROVIDER:  Lajean Manes, MD [address] [place] [address] [place], [place] [address]    RESPONSIBLE PARTY:   Spouse Darrell Ware (former pharmacist) ([phone], [phone]. [email or web address]   RECOMMENDATIONS and PLAN:  1. Advance Care Planning:              A. Directives:  DNR. MOST form reviewed in the past; work in progress. Patient would prefer not to be on a vent, or to limit his time on a vent should there be little chance of getting off machine. He states yes to antibiotics, and to determine use of IVFs at the time of need. No to tube feeding. He hasn't yet signed the form.   B. Goals of Care: For the first time, patient brought up topic of Hospice services. We discussed criteria for admission (no aggressive care/avoidance of hospitalization/limitation of specialist visits) and anticipated prognosis of 6 months or less. We discussed what hospice could provide (SN/aide/SW/Chaplin support) and it's focus on symptom management, quality of life, and working towards goals. Spouse Darrell Ware present during this conversation. Patient has not been taking his OFEV d/t side effect bleeding/stomach achiness.    2. IPF: Patient is continuing downward decline of his pulmonary function. Now need oxygen 2-3 liters at rest, and 6-10 lpm when ambulating just 5 feet. Three weeks ago exacerbation for which he was treated with a prednisone dose pack and doxycycline. He was tested negative for COVID. Four days ago developed a racking cough; onset late afternoon and  lasting throughout eve/night so that he cannot sleep. Patient notes lack of appetite though consumes adequate diet.  -I e-prescribed Hydodan 5-1.5 mg syrup. 5mg  q 6h prn cough  -Patient has a f/u with pulmonary team; plans to revisit possibility of MDI steroid. Wasn't particularly helpful in the past but may be worth another trial as oral steroids are so helpful.  3. Follow up: Tue 03/29/2019 @ 11am. Also has an appointment  04/18/2019 @ 1pm   I spent 60 minutes providing this consultation, from 3 pm-4 pm. More than 50% of the time in this consultation was spent coordinating communication.     HISTORY OF PRESENT ILLNESS:  Darrell Ware is a 86 y.o.male with late stage Pulmonary Fibrosis. This is a f/u Palliative Care visit from Nov 6th.   CODE STATUS: DNR   PPS: 50% (from 60%)  HOSPICE ELIGIBILITY/DIAGNOSIS: TBD  PAST MEDICAL HISTORY:  Past Medical History:  Diagnosis Date  . Atelectasis   . Chronic bronchitis   . Hemorrhoid   . Inguinal hernia    Left  . Postural hypotension   . RBBB 2010    SOCIAL HX:  Social History   Tobacco Use  . Smoking status: Former Smoker    Packs/day: 0.10    Years: 4.00    Pack years: 0.40    Types: Pipe, Cigars    Quit date: 02/24/1953    Years since quitting: 66.1  . Smokeless tobacco: Never Used  . Tobacco comment: only smoked an occ pipe/cigar x 3-4 years  Substance Use Topics  .  Alcohol use: Yes    Alcohol/week: 7.0 standard drinks    Types: 7 Glasses of wine per week    ALLERGIES:  Allergies  Allergen Reactions  . Peanut-Containing Drug Products     Cough,wheezing - GENERAL PEANUT ALLERGY  . Pirfenidone     Liver trouble  . Cialis [Tadalafil] Rash     PERTINENT MEDICATIONS:  Outpatient Encounter Medications as of 03/17/2019  Medication Sig  . HYDROcodone-homatropine (HYCODAN) 5-1.5 MG/5ML syrup Take 5 mLs by mouth every 6 (six) hours as needed for cough.  Marland Kitchen albuterol (VENTOLIN HFA) 108 (90 Base) MCG/ACT inhaler Inhale 2 puffs  into the lungs every 6 (six) hours as needed for wheezing or shortness of breath.  . AZOPT 1 % ophthalmic suspension Place 2 drops into the right eye 3 (three) times daily.   . Beta Sitosterol 40 % POWD 1 tablet by Does not apply route 2 (two) times daily.  . Cyanocobalamin (B-12) 2500 MCG SUBL Place under the tongue daily.  Marland Kitchen doxycycline (VIBRA-TABS) 100 MG tablet Take 1 tablet (100 mg total) by mouth 2 (two) times daily.  . Flaxseed, Linseed, (FLAX SEED OIL PO) Take by mouth daily.  . fluticasone (FLONASE) 50 MCG/ACT nasal spray Place 2 sprays into the nose as needed.   Marland Kitchen LOTEMAX 0.5 % GEL Place 1 drop into the right eye 2 (two) times daily.   . NON FORMULARY Shaklee supplement twice daily  . OFEV 100 MG CAPS TAKE 1 CAPSULE BY MOUTH TWICE DAILY WITH FOOD. CALL [phone] TO REFILL  . ZIOPTAN 0.0015 % SOLN Place 1 drop into the right eye daily.   No facility-administered encounter medications on file as of 03/17/2019.    PHYSICAL EXAM:   Slender, elderly, pleasantly conversant. No dyspnea with conversation at rest, on O2 3 lpm. Spouse Darrell Ware present. PE deferred d/t telehealth nature of visit.  Darrell Lis, NP

## 2019-03-21 ENCOUNTER — Telehealth: Payer: Self-pay | Admitting: Internal Medicine

## 2019-03-21 NOTE — Telephone Encounter (Signed)
Spoke with patient's wife. She stated that the patient's health status has been declining since New Years. He has had increased SOB. He is currently on 9L of O2. The smallest amount of movement will cause his O2 to drop into the high 70s even on 9L. He has a non-productive cough. He was prescribed Hycodan last week by the palliative care NP. She was advised to sent him to the ED by the palliative care but she stated that he does not want to go to the ED. Denied any fevers. He had a negative covid test recently.   She wants to know what to do next. She prefers to hear recommendations from MR only at this point.   MR, please advise. Thanks!

## 2019-03-21 NOTE — Telephone Encounter (Signed)
pls add a 15 min telephone or video visit for 03/22/19 Recommend hospice but need to discuss first - which I can tomorow

## 2019-03-21 NOTE — Telephone Encounter (Signed)
Spoke with patient's wife. She has agreed to [employer] tomorrow at [facility] [employer]. Preferred number has been placed in the appt note.   Nothing further needed at time of call.

## 2019-03-21 NOTE — Telephone Encounter (Signed)
ATC to call Vernona Rieger at the number provided, received a message of "service is temporarily available, please try again later". Will attempt to call Vernona Rieger back.

## 2019-03-21 NOTE — Telephone Encounter (Signed)
Noon TC from spouse Vernona Rieger, who reports patient with increased dyspnea with minimal exertion, such as transferring form bed to bedside commode. Resting sats 93% on 9 LPM [place] (HR 102), desaturates to 70's with transfers. Breathing labored. He is at his baseline mentation. Vernona Rieger reports he is running a low grade temp, treating with Tylenol.No cough. Vernona Rieger has a call into patient's pulmonary team, but missed their recent call back.  I advised Vernona Rieger to call back the Pulmonary Team, but advised that if she can't get in contact with them immediately that she should call 911 and have patient transported to Hospital for further eval/RX.  Vernona Rieger had initially asked about Hospice Referral, but when she questioned the patient, he indicated he would wish hospital evaluation.  Vernona Rieger has my contact information and will keep me updated.  Holly Bodily NP-C [phone]

## 2019-03-22 ENCOUNTER — Telehealth: Payer: Self-pay | Admitting: Internal Medicine

## 2019-03-22 ENCOUNTER — Other Ambulatory Visit: Payer: Self-pay

## 2019-03-22 ENCOUNTER — Ambulatory Visit (INDEPENDENT_AMBULATORY_CARE_PROVIDER_SITE_OTHER): Payer: Medicare Other | Admitting: Internal Medicine

## 2019-03-22 DIAGNOSIS — J9611 Chronic respiratory failure with hypoxia: Secondary | ICD-10-CM

## 2019-03-22 DIAGNOSIS — Z515 Encounter for palliative care: Secondary | ICD-10-CM

## 2019-03-22 DIAGNOSIS — Z87891 Personal history of nicotine dependence: Secondary | ICD-10-CM

## 2019-03-22 DIAGNOSIS — J84112 Idiopathic pulmonary fibrosis: Secondary | ICD-10-CM

## 2019-03-22 DIAGNOSIS — Z9981 Dependence on supplemental oxygen: Secondary | ICD-10-CM

## 2019-03-22 NOTE — Patient Instructions (Addendum)
ICD-10-CM   1. Idiopathic pulmonary fibrosis (HCC)  J84.112   2. Chronic respiratory failure with hypoxia (HCC)  J96.11   3. Encounter for palliative care in home hospice  Z51.5      Rapid decline  - terminal  - d/w patient wife and patient  Plan  - agree with hold off on ofev  - continue mask o2  - home hospice starting 03/22/2019  - Dr Marchelle Gearing can be primary if family wants - DNR  Followup - on phone/video as needed

## 2019-03-22 NOTE — Progress Notes (Signed)
OV 03/22/2019 - telephone visit  Subjective:  Patient ID: Darrell Ware, male , DOB: [birth year] , age 86 y.o. , MRN: [id number] , ADDRESS: [address] [place] [address]   03/22/2019 -  Advanced IPF on ofev, chronic hypoxemic resp failure   HPI Shaarav H Kimmel 86 y.o. -patient last seen a few months ago and he was already declining.  Then early in January 2021 he had a respiratory exacerbation given doxycycline.  At this point in time yesterday the wife called saying that his decline had been so severe that he was requiring 9 L of oxygen and desaturating and wanted to know my advice.  Patient was already with home palliative care.  I recommended hospice and set up this morning visit.  At this time when I call patient was not able to talk much except a few words.  There are very call emergency medical services out to the home and they just left.  Patient's not able to get enough oxygen through nasal cannula because of deviated nasal septum and the severity of IPF.  His ECOG is 4 and even with minimal movement outside the bed he desaturates on 9 L to 80%.  The wife feels his and dyspnea.  She is agreeable for home hospice and this has been started at this point.   Patient has been off nintedanib since early January 2021.  ROS - per HPI     has a past medical history of Atelectasis, Chronic bronchitis, Hemorrhoid, Inguinal hernia, Postural hypotension, and RBBB (2010).   reports that he quit smoking about 66 years ago. His smoking use included pipe and cigars. He has a 0.40 pack-year smoking history. He has never used smokeless tobacco.  Past Surgical History:  Procedure Laterality Date  . CATARACT EXTRACTION     bilat  . CORNEAL TRANSPLANT  1999   Right  . left arm fracture  1950  . RETINAL DETACHMENT SURGERY     Bilat  . TONSILLECTOMY  1935  . VASECTOMY  1972    Allergies  Allergen Reactions  . Peanut-Containing Drug Products     Cough,wheezing - GENERAL  PEANUT ALLERGY  . Pirfenidone     Liver trouble  . Cialis [Tadalafil] Rash    Immunization History  Administered Date(s) Administered  . Fluad Quad(high Dose 65+) 11/23/2018  . Influenza Split 11/25/2011, 01/02/2014  . Influenza, High Dose Seasonal PF 11/25/2015, 01/09/2017, 12/29/2017  . Influenza,inj,Quad PF,6+ Mos 11/24/2012, 11/25/2014  . Pneumococcal-Unspecified 04/24/2012    Family History  Problem Relation Age of Onset  . Heart failure Father   . Heart failure Mother   . Macular degeneration Brother   . Macular degeneration Sister      Current Outpatient Medications:  .  albuterol (VENTOLIN HFA) 108 (90 Base) MCG/ACT inhaler, Inhale 2 puffs into the lungs every 6 (six) hours as needed for wheezing or shortness of breath., Disp: 18 g, Rfl: 1 .  AZOPT 1 % ophthalmic suspension, Place 2 drops into the right eye 3 (three) times daily. , Disp: , Rfl:  .  Beta Sitosterol 40 % POWD, 1 tablet by Does not apply route 2 (two) times daily., Disp: , Rfl:  .  Cyanocobalamin (B-12) 2500 MCG SUBL, Place under the tongue daily., Disp: , Rfl:  .  doxycycline (VIBRA-TABS) 100 MG tablet, Take 1 tablet (100 mg total) by mouth 2 (two) times daily., Disp: 14 tablet, Rfl: 0 .  Flaxseed, Linseed, (FLAX SEED OIL  PO), Take by mouth daily., Disp: , Rfl:  .  fluticasone (FLONASE) 50 MCG/ACT nasal spray, Place 2 sprays into the nose as needed. , Disp: , Rfl:  .  HYDROcodone-homatropine (HYCODAN) 5-1.5 MG/5ML syrup, Take 5 mLs by mouth every 6 (six) hours as needed for cough., Disp: , Rfl:  .  LOTEMAX 0.5 % GEL, Place 1 drop into the right eye 2 (two) times daily. , Disp: , Rfl:  .  NON FORMULARY, [place] supplement twice daily, Disp: , Rfl:  .  OFEV 100 MG CAPS, TAKE 1 CAPSULE BY MOUTH TWICE DAILY WITH FOOD. CALL [phone] TO REFILL, Disp: 60 capsule, Rfl: 4 .  ZIOPTAN 0.0015 % SOLN, Place 1 drop into the right eye daily., Disp: , Rfl:       Objective:   There were no vitals filed for this  visit.  Estimated body mass index is 23.08 kg/m as calculated from the following:   Height as of 11/23/18: 6\' 2"  (1.88 m).   Weight as of 11/23/18: 179 lb 12.8 oz (81.6 kg).  @WEIGHTCHANGE @  There were no vitals filed for this visit.   Physical Exam on the phone the patient sounded weak and exhausted          Assessment:       ICD-10-CM   1. Idiopathic pulmonary fibrosis (HCC)  J84.112   2. Chronic respiratory failure with hypoxia (HCC)  J96.11   3. Encounter for palliative care in home hospice  Z51.5        Plan:     Patient Instructions     ICD-10-CM   1. Idiopathic pulmonary fibrosis (HCC)  J84.112   2. Chronic respiratory failure with hypoxia (HCC)  J96.11   3. Encounter for palliative care in home hospice  Z51.5      Rapid decline  - terminal  - d/w patient wife and patient -probably is a few weeks left feels a major change in event.  His respiratory failure and IPF has progressed severely.  Plan  - agree with hold off on ofev  - continue mask o2  - home hospice starting 03/22/2019  - Dr can be primary if family wants - DNR  Followup - on phone/video as needed     SIGNATURE    Dr. 03/24/2019, M.D., F.C.C.P,  Pulmonary and Critical Care Medicine Staff Physician, [facility] Director - Interstitial Lung Disease  Program  Pulmonary Fibrosis [facility] at [facility], [facility], [place]  Pager: [phone], If no answer or between  15:00h - 7:00h: call [phone] Telephone: [phone]  9:21 AM 03/22/2019

## 2019-03-22 NOTE — Telephone Encounter (Signed)
03/21/2018 8am:  EMS called me from the home. They were summoned by spouse d/t low oxygen sats 70's despite oxygen at 10LPM. At the time of EMS call to me, patient was on non-rebreather, Sats 90%, patient speaking in complete sentences and seemed without distress. Patient did not wish to be transported to hospital. Spouse requesting hospice referral. I okayed EMS to leave non-rebreather in the home, for patient use if needed. Spouse to maintain O2 at 10 LPM, use non-rebreather hooked up to portable take prn respiratory distress or desaturation below 88%.  I contacted Dr. Laverle Hobby office. Left VM on Brittany's service (Dr. Laverle Hobby office nurse) requesting patient referral to [facility] and asking if Dr. Pete Glatter would be agreeable to continue serving as attending physician, under Hospice. I e-prescribed MSO4 concentrate 20mg /ml to [facility]. Spouse believes she can arrange pick up of this. I will swing by the home by 9:45am to check in on patient, and instruct spouse how to administer. NP-C [phone]

## 2019-03-22 NOTE — Telephone Encounter (Signed)
Dr. Marchelle Gearing had a telephone visit today 1/26 with patient and his wife Vernona Rieger. While having this visit, it was discussed between Dr. Marchelle Gearing and Vernona Rieger to agree for home hospice.  While looking through pt's chart, we see that pt has already had a few palliative care encounters the last one being 03/17/19.  Dr. Marchelle Gearing has agreed to be the primary attending for pt's hospice care due to his diagnosis of Idiopathic Pulmonary Fibrosis. He is wanting to have hospice check with pt and family to see if they would like to have this done. Routing to Holly Bodily with [facility].

## 2019-03-28 DEATH — deceased
